# Patient Record
Sex: Male | Born: 1955 | Race: White | Hispanic: No | Marital: Married
Health system: Midwestern US, Academic
[De-identification: ages and names within clinical notes are randomized; demographics above are authoritative.]

---

## 2020-07-30 ENCOUNTER — Encounter: Admit: 2020-07-30 | Discharge: 2020-07-30 | Payer: MEDICARE

## 2020-07-30 NOTE — Telephone Encounter
07/30/20- Records Received from [facility] and [facility] have been scanned to chart and available in the [facility] button.  Thank you,  Ascencion Dike  HIM Specialist - Cardiovascular Medicine  [facility]  [address]  [place], [place] [address]  [phone]

## 2020-07-30 NOTE — Telephone Encounter
07/30/20 Records requested per staff message below edh  ____________________________________    Patient is now scheduled on 01/25 with Dr. Sandria Manly   Please request records from [facility] Phone: [phone]  And [facility]  Phone: [phone]. Per  patient,  had cardiac care (Consult and stress test)  there a year ago     Thank you

## 2020-07-31 ENCOUNTER — Encounter: Admit: 2020-07-31 | Discharge: 2020-07-31 | Payer: MEDICARE

## 2020-08-07 ENCOUNTER — Encounter: Admit: 2020-08-07 | Discharge: 2020-08-07 | Payer: MEDICARE

## 2020-08-12 ENCOUNTER — Encounter: Admit: 2020-08-12 | Discharge: 2020-08-12 | Payer: MEDICARE

## 2020-08-12 DIAGNOSIS — E1169 Type 2 diabetes mellitus with other specified complication: Secondary | ICD-10-CM

## 2020-08-12 DIAGNOSIS — E785 Hyperlipidemia, unspecified: Secondary | ICD-10-CM

## 2020-08-12 DIAGNOSIS — I10 Essential (primary) hypertension: Secondary | ICD-10-CM

## 2020-08-12 DIAGNOSIS — I739 Peripheral vascular disease, unspecified: Secondary | ICD-10-CM

## 2020-08-12 DIAGNOSIS — I447 Left bundle-branch block, unspecified: Secondary | ICD-10-CM

## 2020-08-12 MED ORDER — CHLORTHALIDONE 25 MG PO TAB
12.5 mg | ORAL_TABLET | Freq: Every day | ORAL | 1 refills | Status: AC
Start: 2020-08-12 — End: ?

## 2020-08-19 ENCOUNTER — Encounter: Admit: 2020-08-19 | Discharge: 2020-08-19 | Payer: MEDICARE

## 2020-08-19 DIAGNOSIS — I739 Peripheral vascular disease, unspecified: Secondary | ICD-10-CM

## 2020-08-19 DIAGNOSIS — I10 Essential (primary) hypertension: Secondary | ICD-10-CM

## 2020-08-19 DIAGNOSIS — E785 Hyperlipidemia, unspecified: Secondary | ICD-10-CM

## 2020-08-19 DIAGNOSIS — I447 Left bundle-branch block, unspecified: Secondary | ICD-10-CM

## 2020-08-19 DIAGNOSIS — E1169 Type 2 diabetes mellitus with other specified complication: Secondary | ICD-10-CM

## 2020-08-19 LAB — LIPID PROFILE
Lab: 137
Lab: 247 — ABNORMAL HIGH (ref <150)
Lab: 29 — ABNORMAL LOW (ref >40)
Lab: 49 — ABNORMAL HIGH (ref 5–40)
Lab: 5
Lab: 59

## 2020-08-19 LAB — BASIC METABOLIC PANEL
Lab: 0.9
Lab: 101
Lab: 137
Lab: 19
Lab: 25
Lab: 4.1
Lab: 80

## 2020-08-19 LAB — BNP (B-TYPE NATRIURETIC PEPTI): Lab: 10 — ABNORMAL HIGH (ref 70–105)

## 2020-09-23 ENCOUNTER — Encounter: Admit: 2020-09-23 | Discharge: 2020-09-23 | Payer: MEDICARE

## 2020-09-23 DIAGNOSIS — I10 Essential (primary) hypertension: Secondary | ICD-10-CM

## 2020-09-23 DIAGNOSIS — E785 Hyperlipidemia, unspecified: Secondary | ICD-10-CM

## 2020-09-23 MED ORDER — CHLORTHALIDONE 25 MG PO TAB
25 mg | ORAL_TABLET | Freq: Every day | ORAL | 3 refills | Status: AC
Start: 2020-09-23 — End: ?

## 2020-09-23 NOTE — Patient Instructions
Increase chlorthalidone to 25mg  daily  Recheck Lab in 1 wk  Follow up in 3 months  Follow up as directed.  Call sooner if issues.  Call the [place] nursing line at [phone].  Leave a detailed message for the nurse in [place] with how we can assist you and we will call you back.

## 2020-10-17 ENCOUNTER — Encounter: Admit: 2020-10-17 | Discharge: 2020-10-17 | Payer: MEDICARE

## 2020-10-17 DIAGNOSIS — I10 Essential (primary) hypertension: Secondary | ICD-10-CM

## 2020-10-17 DIAGNOSIS — E785 Hyperlipidemia, unspecified: Secondary | ICD-10-CM

## 2020-10-17 LAB — BASIC METABOLIC PANEL
Lab: 1.2 — ABNORMAL HIGH (ref 0.72–1.25)
Lab: 100
Lab: 109 — ABNORMAL HIGH (ref 70–105)
Lab: 131 — ABNORMAL LOW (ref 136–145)
Lab: 18 — ABNORMAL LOW (ref 23–31)
Lab: 22
Lab: 9.9

## 2020-10-27 ENCOUNTER — Encounter: Admit: 2020-10-27 | Discharge: 2020-10-27 | Payer: MEDICARE

## 2020-10-27 DIAGNOSIS — E782 Mixed hyperlipidemia: Secondary | ICD-10-CM

## 2020-10-27 DIAGNOSIS — I10 Essential (primary) hypertension: Secondary | ICD-10-CM

## 2020-10-27 NOTE — Telephone Encounter
Left message for patient with lab results and recommendations. Phone number provided for any additional questions or concerns. Lab order faxed to [facility] to repeat in 1 week.

## 2020-10-27 NOTE — Telephone Encounter
-----   Message from Altamease Oiler, MD sent at 10/22/2020  4:17 PM CDT -----  Lets have him increase his water intake.  Please ask him to double what ever he is drinking on a daily basis.  Repeat basic metabolic panel in 1 week.  Thanks Star Resler!  ----- Message -----  From: Floy Sabina, RN  Sent: 10/17/2020  10:33 AM CDT  To: Altamease Oiler, MD    Labs for your review and recommendations. Last OV 3/8. Increased chlorthalidone to 25mg  daily at that time. Bump in BUN/Creat. Thanks!

## 2020-11-12 ENCOUNTER — Encounter: Admit: 2020-11-12 | Discharge: 2020-11-12 | Payer: MEDICARE

## 2020-11-12 DIAGNOSIS — E782 Mixed hyperlipidemia: Secondary | ICD-10-CM

## 2020-11-12 DIAGNOSIS — I10 Essential (primary) hypertension: Secondary | ICD-10-CM

## 2020-11-12 LAB — BASIC METABOLIC PANEL
BLD UREA NITROGEN: 17
CALCIUM: 9.5
CHLORIDE: 105
CO2: 25
CREATININE: 1
GFR ESTIMATED: 71
GLUCOSE,PANEL: 140 — ABNORMAL HIGH (ref 70–105)
POTASSIUM: 3.9
SODIUM: 138

## 2021-01-06 ENCOUNTER — Encounter: Admit: 2021-01-06 | Discharge: 2021-01-06 | Payer: MEDICARE

## 2021-01-06 DIAGNOSIS — R600 Localized edema: Secondary | ICD-10-CM

## 2021-01-06 DIAGNOSIS — I10 Essential (primary) hypertension: Secondary | ICD-10-CM

## 2021-01-06 DIAGNOSIS — E785 Hyperlipidemia, unspecified: Secondary | ICD-10-CM

## 2021-01-06 LAB — BASIC METABOLIC PANEL
ANION GAP: 15 — ABNORMAL HIGH (ref 0–14)
BLD UREA NITROGEN: 25
CALCIUM: 9.8
CHLORIDE: 105
CO2: 23
CREATININE: 1.3 — ABNORMAL HIGH (ref 0.72–1.25)
GFR ESTIMATED: 55 — ABNORMAL LOW (ref >59)
GLUCOSE,PANEL: 99
POTASSIUM: 4.8
SODIUM: 138

## 2021-01-06 MED ORDER — CHLORTHALIDONE 25 MG PO TAB
12.5 mg | ORAL_TABLET | Freq: Every day | ORAL | 3 refills | Status: AC
Start: 2021-01-06 — End: ?

## 2021-01-09 ENCOUNTER — Encounter: Admit: 2021-01-09 | Discharge: 2021-01-09 | Payer: MEDICARE

## 2021-01-09 DIAGNOSIS — R7989 Other specified abnormal findings of blood chemistry: Secondary | ICD-10-CM

## 2021-01-09 NOTE — Telephone Encounter
Left a message with lab results and recommendations for repeat labs to be drawn in 2 weeks. Patient uses [facility] lab, order placed and sent to lab.

## 2021-01-09 NOTE — Telephone Encounter
-----   Message from Altamease Oiler, MD sent at 01/09/2021  8:56 AM CDT -----  We cut his chlorthalidone down at the last visit.  Lets repeat a BMP in 2 weeks.  Thanks!  ----- Message -----  From: Lauralee Evener, RN  Sent: 01/06/2021   1:08 PM CDT  To: Altamease Oiler, MD    Lab results following today's appt. Patient's creat is up from 1.09 to 1.37.

## 2021-01-23 ENCOUNTER — Encounter: Admit: 2021-01-23 | Discharge: 2021-01-23 | Payer: MEDICARE

## 2021-01-23 DIAGNOSIS — R7989 Other specified abnormal findings of blood chemistry: Secondary | ICD-10-CM

## 2021-01-23 LAB — BASIC METABOLIC PANEL
BLD UREA NITROGEN: 30 — ABNORMAL HIGH (ref 8.4–25.7)
CALCIUM: 9.7
CHLORIDE: 106
CO2: 24
CREATININE: 1.5 — ABNORMAL HIGH (ref 0.72–1.25)
GLUCOSE,PANEL: 80
POTASSIUM: 4.3
SODIUM: 138

## 2021-02-06 ENCOUNTER — Encounter: Admit: 2021-02-06 | Discharge: 2021-02-06 | Payer: MEDICARE

## 2021-02-06 DIAGNOSIS — R7989 Other specified abnormal findings of blood chemistry: Secondary | ICD-10-CM

## 2021-02-06 LAB — BASIC METABOLIC PANEL
ANION GAP: 13
BLD UREA NITROGEN: 19
CALCIUM: 9.5
CHLORIDE: 106 — ABNORMAL HIGH
CO2: 23
CREATININE: 1.1
GFR ESTIMATED: 67
GLUCOSE,PANEL: 158 — ABNORMAL HIGH
POTASSIUM: 4
SODIUM: 140

## 2021-02-06 NOTE — Telephone Encounter
Left voicemail with recommendations. Left call back number for any questions, comments, or concerns. Instructed patient to call should he have elevated blood pressures.

## 2021-02-06 NOTE — Telephone Encounter
-----   Message from Altamease Oiler, MD sent at 02/06/2021  3:31 PM CDT -----  Labs are good.  Let him know no changes.

## 2021-02-11 ENCOUNTER — Encounter: Admit: 2021-02-11 | Discharge: 2021-02-11 | Payer: MEDICARE

## 2021-02-11 NOTE — Telephone Encounter
Patient reports bp's following chlorthalidone discontinued.    7/23 124/89, 7/24 130/77, 7/25 124/80, 7/26 124/79, 7/27 124/80.      Routed to [facility].

## 2021-02-19 ENCOUNTER — Encounter: Admit: 2021-02-19 | Discharge: 2021-02-19 | Payer: MEDICARE

## 2021-02-19 DIAGNOSIS — I10 Essential (primary) hypertension: Secondary | ICD-10-CM

## 2021-02-19 NOTE — Telephone Encounter
Received a phone call from patient reporting elevated BP's.     8/4: 191/96   8/3: 196/94    He reports that he has been taking all of his prescribed medications. These blood pressures are 1-2 hours after taking morning medications. Pt denies headache, chest pain, vision changes, and is asymptomatic. Discussed if he starts to develop any of this symptoms he will go to the ED. Will discuss with Dr. Sandria Manly.

## 2021-02-19 NOTE — Telephone Encounter
Altamease Oiler, MD  Cvm Nurse [place] 5 minutes ago (4:34 PM)       Restart the chlorthalidone. Repeat a basic metabolic panel in 1 week. If he continues to have systolic blood pressures greater than 190 mmHg please ask him to go to the [facility] emergency department. Thanks!    Message text

## 2021-02-19 NOTE — Telephone Encounter
Discussed recommendations with patient. He verbalized understanding. Lab recs faxed to [facility] hospital.

## 2021-02-27 ENCOUNTER — Encounter: Admit: 2021-02-27 | Discharge: 2021-02-27 | Payer: MEDICARE

## 2021-02-27 DIAGNOSIS — I10 Essential (primary) hypertension: Secondary | ICD-10-CM

## 2021-02-27 LAB — BASIC METABOLIC PANEL
ANION GAP: 11
BLD UREA NITROGEN: 19
CALCIUM: 9.8
CHLORIDE: 104
CO2: 26
CREATININE: 1.3 — ABNORMAL HIGH (ref 0.72–1.25)
GFR ESTIMATED: 56 — ABNORMAL LOW (ref >59)
GLUCOSE,PANEL: 94
POTASSIUM: 4.2
SODIUM: 137

## 2021-03-11 ENCOUNTER — Encounter: Admit: 2021-03-11 | Discharge: 2021-03-11 | Payer: MEDICARE

## 2021-03-11 NOTE — Telephone Encounter
Called pt to discuss lab results.  He states that he is drinking plenty of fluid.  His BPs are much improved, around 120/80s. He states that he is doing okay.  He states that when he makes a u-turn in his car, he gets a little lightheaded.  He states that this is the only time it happens.  Mayo is also battling some allergies at this time.  He is taking an OTC antihistamine.  He has not yet been tested for COVID, but states that if he will go get tested if he continues to have symptoms.  Will callback with any questions, concerns or problems.

## 2021-03-11 NOTE — Telephone Encounter
-----   Message from Altamease Oiler, MD sent at 03/09/2021  4:32 PM CDT -----  Make sure he keeps up on fluid intake.  How is his blood pressure been back on chlorthalidone?  ----- Message -----  From: Rogelia Boga, RN  Sent: 02/27/2021   9:16 AM CDT  To: Altamease Oiler, MD    Labs for your review after restarting chlorthalidone.  Creatinine bumped a little.  Please let me know if you have any additional recommendations.  Thanks!

## 2021-06-23 ENCOUNTER — Encounter: Admit: 2021-06-23 | Discharge: 2021-06-23 | Payer: MEDICARE

## 2021-06-23 DIAGNOSIS — R600 Localized edema: Secondary | ICD-10-CM

## 2021-06-23 DIAGNOSIS — E785 Hyperlipidemia, unspecified: Secondary | ICD-10-CM

## 2021-06-23 DIAGNOSIS — I10 Essential (primary) hypertension: Secondary | ICD-10-CM

## 2021-07-16 ENCOUNTER — Encounter: Admit: 2021-07-16 | Discharge: 2021-07-16 | Payer: MEDICARE

## 2021-07-16 NOTE — Telephone Encounter
Hypertension    Current blood pressure: 166/99  Current heart rate:   Baseline BP/HR: 160s-180s/90s  Associated symptoms: None  Medication Information: Chlorthalidone 12.5mg  daily was recently discontinued at 06/23/21 OV due to hypotension and dizzy spells. Patient states that he recently restarted his medication on his own due to higher pressures.   Additional relevant information:  Patient states that he's doing fine no complaints at the present time. He is just concerned about his pressures being so high. He states that he restarted chlorthalidone 12.5mg  a few days ago and his pressures are still pretty elevated, in the 160s/90s range. Patient states that his neuropathy has been getting worse and might contribute to some of the elevated pressures to his pain. Confirmed with patient that he is taking all of his medications as prescribed.       Care Advice: Take all medication as prescribed by your physician. Continue to monitor blood pressures. Will discuss with WTL and call patient with recommendations. Patient verbalized understanding.     Disposition: RN to Call Patient Back with Provider Recommendations

## 2021-12-02 ENCOUNTER — Encounter: Admit: 2021-12-02 | Discharge: 2021-12-02 | Payer: MEDICARE

## 2021-12-24 ENCOUNTER — Encounter: Admit: 2021-12-24 | Discharge: 2021-12-24 | Payer: MEDICARE

## 2021-12-24 DIAGNOSIS — E785 Hyperlipidemia, unspecified: Secondary | ICD-10-CM

## 2021-12-24 NOTE — Telephone Encounter
Patient is scheduled for an office visit with WTL on 01/05/22. It was noticed he needed to have fasting lipid panel prior to appointment. Order placed and faxed to [facility] hospital where the patient has had labs drawn before. Left a message for the patient with this information and requested to have drawn prior to appointment. Callback number provided for further questions or concerns.

## 2021-12-30 ENCOUNTER — Encounter: Admit: 2021-12-30 | Discharge: 2021-12-30 | Payer: MEDICARE

## 2021-12-30 DIAGNOSIS — E785 Hyperlipidemia, unspecified: Secondary | ICD-10-CM

## 2021-12-30 LAB — LIPID PROFILE
CHOLESTEROL: 126
HDL: 29 — ABNORMAL LOW (ref >=40)
LDL: 56
TRIGLYCERIDES: 207 — ABNORMAL HIGH (ref <150)
VLDL: 41 — ABNORMAL HIGH (ref 5–40)

## 2022-01-05 ENCOUNTER — Encounter: Admit: 2022-01-05 | Discharge: 2022-01-05 | Payer: MEDICARE

## 2022-01-05 DIAGNOSIS — I4891 Unspecified atrial fibrillation: Secondary | ICD-10-CM

## 2022-01-05 DIAGNOSIS — Z136 Encounter for screening for cardiovascular disorders: Secondary | ICD-10-CM

## 2022-01-05 DIAGNOSIS — I10 Essential (primary) hypertension: Secondary | ICD-10-CM

## 2022-01-05 DIAGNOSIS — I739 Peripheral vascular disease, unspecified: Secondary | ICD-10-CM

## 2022-01-05 DIAGNOSIS — E785 Hyperlipidemia, unspecified: Secondary | ICD-10-CM

## 2022-01-05 LAB — THYROID STIMULATING HORMONE-TSH: TSH: 1

## 2022-01-05 LAB — CBC
HEMATOCRIT: 43 K/UL — ABNORMAL LOW (ref 150–400)
HEMOGLOBIN: 14 % (ref 11–15)
MCH: 31 % (ref 41–77)
MCHC: 34 % (ref 24–44)
MCV: 91 FL (ref 7–11)
MPV: 10 % (ref >60)
PLATELET COUNT: 238 % (ref 4–12)
RBC COUNT: 4.7 g/dL (ref 32.0–36.0)
RDW: 11 % (ref 0–2)
WBC COUNT: 9.7 pg — ABNORMAL HIGH (ref 4.23–9.07)

## 2022-01-05 LAB — COMPREHENSIVE METABOLIC PANEL
ALBUMIN: 4.2 — ABNORMAL HIGH (ref 34–104)
ALK PHOSPHATASE: 68
ALT: 14 — ABNORMAL HIGH (ref 0.30–1.00)
ANION GAP: 10
AST: 16
BLD UREA NITROGEN: 28 — ABNORMAL HIGH (ref 8.4–25.7)
CALCIUM: 9.8 — ABNORMAL HIGH (ref 0.7–1.3)
CHLORIDE: 106
CO2: 22 — ABNORMAL LOW (ref 23–31)
CREATININE: 1.2 — ABNORMAL HIGH (ref 0.72–1.25)
GFR ESTIMATED: 59
GLUCOSE,PANEL: 107 — ABNORMAL HIGH (ref 70–105)
POTASSIUM: 4.3 — ABNORMAL LOW (ref 136–145)
SODIUM: 138
TOTAL BILIRUBIN: 0.7 — ABNORMAL LOW (ref >59.00)
TOTAL PROTEIN: 7.7 (ref >59.00)

## 2022-01-05 LAB — MAGNESIUM: MAGNESIUM: 1.4 — ABNORMAL LOW (ref 1.6–2.6)

## 2022-01-05 MED ORDER — APIXABAN 5 MG PO TAB
5 mg | ORAL_TABLET | Freq: Two times a day (BID) | ORAL | 3 refills | Status: CN
Start: 2022-01-05 — End: ?

## 2022-01-05 MED ORDER — SPIRONOLACTONE 25 MG PO TAB
12.5 mg | ORAL_TABLET | Freq: Every day | ORAL | 3 refills | 90.00000 days | Status: AC
Start: 2022-01-05 — End: ?

## 2022-01-05 MED ORDER — MAGNESIUM OXIDE 400 MG MAGNESIUM PO CAP
400 mg | ORAL_CAPSULE | Freq: Every day | ORAL | 0 refills | Status: CN
Start: 2022-01-05 — End: ?

## 2022-01-05 MED ORDER — MAGNESIUM OXIDE 400 MG MAGNESIUM PO CAP
400 mg | ORAL_CAPSULE | Freq: Every day | ORAL | 0 refills | Status: AC
Start: 2022-01-05 — End: ?

## 2022-01-05 NOTE — Telephone Encounter
Dr. Sandria Manly reviewed patient's most recent lab results. He would like for patient to start Magnesium Oxide 400mg  daily for 14 days. Called and discussed with patient. Patient is agreeable to care plan and has no further questions at this time.       Is is to go home and check his medications. If he is not already on another blood thinner Dr. would like for patient to start Eliquis 5mg  BID.

## 2022-01-05 NOTE — Patient Instructions
Thank you for visiting our office today.    We would like to make the following medication adjustments:      Decrease Spironolactone to 12.5mg  (half tablet)      Otherwise continue the same medications as you have been doing.          We will be pursuing the following tests after your appointment today:       Orders Placed This Encounter    CBC    COMPREHENSIVE METABOLIC PANEL    THYROID STIMULATING HORMONE-TSH    MAGNESIUM    ECG 12-LEAD    ECG Today (all locations)    spironolactone (ALDACTONE) 25 mg tablet     **Have labs drawn today**    Check your medications at home and all [place] back with updated medication list.     We will plan to see you back in 3 months.  Please call us in the meantime with any questions or concerns.        Please allow 5-7 business days for our providers to review your results. All normal results will go to MyChart. If you do not have Mychart, it is strongly recommended to get this so you can easily view all your results. If you do not have mychart, we will attempt to call you once with normal lab and testing results. If we cannot reach you by phone with normal results, we will send you a letter.  If you have not heard the results of your testing after one week please give [place] a call.       Your Cardiovascular Medicine [place] [facility] Team Brett Canales, Pilar Jarvis, Shawna Orleans, and [place])  phone number is [phone].

## 2022-01-05 NOTE — Progress Notes
Date of Service: 01/05/2022    Cole Fischer is a 66 y.o. male.       Chief Complaint: Follow-up    History of Present Illness:     I had the pleasure of seeing Cole Fischer back in our [place] office this morning for cardiovascular followup.   ?  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    Since our last visit Cole Fischer tells me he has gotten along pretty well.  He is still having some lightheadedness and low blood pressure readings from time to time.  He did come in today with note from Dr. Andreas Newport about potentially reducing his spironolactone given his hypotension.      Recently he developed an infection in the stump of his right lower extremity.  He was just started on cephalexin by Dr. Andreas Newport.  He tells me despite this he is feeling well.  No fevers or chills.  Just the other day gotten 10,000 steps despite using crutches.  He did not have any chest pain or shortness of breath with this.  No orthopnea, paroxysmal nocturnal dyspnea, or lower extremity swelling.  ?  We did obtain an ECG in the office today.  My interpretation is atrial fibrillation.  Ventricular rate 86 bpm.  Left bundle branch block.  QTc 490 ms.         Past Medical History:  Patient Active Problem List    Diagnosis Date Noted   ? Essential hypertension 08/07/2020   ? Hyperlipidemia 08/07/2020   ? Type 2 diabetes mellitus (HCC) 08/07/2020   ? Lower extremity edema 08/07/2020   ? Left bundle branch block 08/07/2020     -08/23/19- MPI- Normal global LV function. EF 61%. Medium defect of mild to moderate intensity during stress. Fixed perfusion abnormality in the mid anterior, apical anterior, and septal and apex segments consistent with attenuation and artifact. Negative stress test for evidence of pharmacologically induced ischemia.   -08/23/19 Echo- EF 55%     ? PVD (peripheral vascular disease) (HCC) 08/07/2020         Review of Systems   Constitutional: Negative.   HENT: Negative.    Eyes: Negative.    Cardiovascular: Negative.    Respiratory: Negative.    Endocrine: Negative.    Hematologic/Lymphatic: Negative.    Skin: Negative.    Musculoskeletal: Negative.    Gastrointestinal: Negative.    Genitourinary: Negative.    Neurological: Negative.    Psychiatric/Behavioral: Negative.    Allergic/Immunologic: Negative.        Vitals:    01/05/22 0807   BP: 124/72   BP Source: Arm, Left Upper   Pulse: 82   SpO2: 99%   O2 Device: None (Room air)   PainSc: Zero   Weight: 103.9 kg (229 lb)   Height: 190.5 cm (6' 3)     Body mass index is 28.62 kg/m?Marland Kitchen    Physical Examination:  General Appearance: No acute distress. Fully alert and oriented.  Skin: Warm. No ulcers or xanthomas.   HEENT: Grossly unremarkable. Lips and oral mucosa without pallor or cyanosis. Moist mucous membranes.   Neck Veins: Normal jugular venous pressure. Neck veins are not distended.  Carotid Arteries: Normal carotid upstroke bilaterally. No bruits.  Chest Inspection: Chest is normal in appearance.  Auscultation/Percussion: Normal  respiratory effort. Lungs clear to auscultation bilaterally. No wheezes, rales, or rhonchi.    Cardiac Rhythm: Regular rhythm. Normal rate.  Cardiac Auscultation: Normal S1 & S2. No S3 or S4. No rub.  Murmurs: No cardiac murmurs.  Peripheral Circulation: Normal peripheral circulation.   Abdominal Aorta: No abdominal aortic bruit.  Extremities: Right lower extremity with above-the-knee amputation.  No swelling in left lower extremity.  Abdominal Exam: Soft, non-tender. No masses, no organomegaly. Normal bowel sounds.  Neurologic Exam: Neurological assessment grossly intact.       Assessment and Plan:  1. New diagnosis atrial fibrillation: ECG today demonstrated atrial fibrillation.  From my understanding this is a new diagnosis.  (Never heard of having an abnormal heart rhythm in the past either.  He is not feeling any palpitations or heart racing.  Recently he has been doing his normal activity without shortness of breath or significant fatigue.  Is unclear how long he has been in the atrial fibrillation.  His CHADS2VASC score is 3 (hypertension, diabetes, age).  I am going to start him on apixaban 5 mg twice daily today once we confirm that he is not actually on a blood thinner at home (I believe he is confusing his cilostazol as being a blood thinner).  He is currently being treated for infection on his right lower extremity.  This may be the reason for the atrial fibrillation.  As he is currently rate controlled I would like to repeat an ECG in 2 weeks to see if he is still in atrial fibrillation.  If he is back in sinus rhythm after treatment with antibiotics then I would recommend simply continuing anticoagulation indefinitely.  If he is still in atrial fibrillation then I think further cardiovascular work-up is indicated.  I would recommend an echocardiogram, stress myocardial perfusion study, 5-day Holter monitor at that time.  He may ultimately need to undergo DCCV with TEE guidance.  We will simply continue his metoprolol for rate control strategy at this time.  In the past has had issues with sinus bradycardia on minimal metoprolol so not sure how well he will handle a rate control strategy.  Ultimately he may need rhythm control strategy or perhaps even permanent pacemaker.  Today we will order basic lab work to include CBC, CMP, TSH.  I will plan to see him back in the clinic as soon as we can arrange a time.  2. Hypertension:   Sounds like he is running low blood pressure at home again.  He is also having some symptoms of lightheadedness.  I agree with Dr. Gilles Chiquito recommendation of reducing spironolactone.  We will cut the dose in half from 25 mg daily to 12.5 mg daily.  Continue all other antihypertensive medications.  3. Conduction disease:  Dietrick has left bundle branch block on ECG.  Etiology is unclear.  He had a stress myocardial perfusion done through [facility] in 2021.  This demonstrated mid to apical anterior, anteroseptal and anterior apical perfusion abnormality.  This was reportedly suggestive of soft tissue attenuation artifact.  He also had echocardiogram at the same time that showed hypokinesis in the mid to anterior septum.  I do wonder if this stress test with actually demonstrating a prior left anterior descending myocardial infarction.  Either way, there is no evidence of myocardial ischemia on the stress test.    Will probably plan to repeat testing with echo and stress myocardial perfusion study in a couple of weeks after repeat ECG.  4. Dyslipidemia:  His goal LDL is less than 70.  Most recent fasting lipid panel from 12/30/2021 with LDL of 56.  Continue current dose of pravastatin.   5. Type 2 diabetes mellitus:  He seems to be doing well on metformin.  He tells me his hemoglobin A1c is well controlled.          Total time spent on today's office visit was 45 minutes. This includes face-to-face in person visit with patient as well as non face-to-face time including review of the electronic medical record, outside records, labs, radiologic studies, cardiovascular studies, formulation of treatment plan, after visit summary, future disposition, personal discussions, and documentation.    Current Medications (including today's revisions)  ? carvediloL (COREG) 6.25 mg tablet Take one tablet by mouth twice daily with meals. Take with food.   ? cephalexin (KEFLEX) 500 mg capsule    ? chlorthalidone (HYGROTON) 25 mg tablet Take one-half tablet by mouth daily.   ? ciclopirox (LOPROX) 0.77 % topical cream Apply  topically to affected area as Needed.   ? cilostazoL (PLETAL) 50 mg tablet Take one tablet by mouth twice daily.   ? gabapentin (NEURONTIN) 800 mg tablet Take one tablet by mouth three times daily. Patient takes 800mg  AM, 400mg  at noon, and 800mg  PM   ? metFORMIN (GLUCOPHAGE) 500 mg tablet Take one tablet by mouth twice daily.   ? pantoprazole DR (PROTONIX) 40 mg tablet Take one tablet by mouth twice daily.   ? pravastatin (PRAVACHOL) 80 mg tablet Take one tablet by mouth daily.   ? spironolactone (ALDACTONE) 25 mg tablet Take one tablet by mouth daily.   ? valsartan (DIOVAN) 320 mg tablet Take one tablet by mouth daily.

## 2022-01-11 ENCOUNTER — Encounter: Admit: 2022-01-11 | Discharge: 2022-01-11 | Payer: MEDICARE

## 2022-01-11 NOTE — Progress Notes
Medication Assistance Program packet has been sent to patient for signatures and other required documents (if applicable) . Once received back, medication assistance coordinator will begin processing     Cole Fischer  Medication Assitance Coordinator  [phone]

## 2022-01-21 ENCOUNTER — Encounter: Admit: 2022-01-21 | Discharge: 2022-01-21 | Payer: MEDICARE

## 2022-01-21 ENCOUNTER — Ambulatory Visit: Admit: 2022-01-21 | Discharge: 2022-01-21 | Payer: MEDICARE

## 2022-01-21 DIAGNOSIS — I4891 Unspecified atrial fibrillation: Secondary | ICD-10-CM

## 2022-01-21 DIAGNOSIS — I447 Left bundle-branch block, unspecified: Secondary | ICD-10-CM

## 2022-01-21 DIAGNOSIS — I10 Essential (primary) hypertension: Secondary | ICD-10-CM

## 2022-01-21 DIAGNOSIS — R600 Localized edema: Secondary | ICD-10-CM

## 2022-01-21 MED ORDER — APIXABAN 5 MG PO TAB
5 mg | ORAL_TABLET | Freq: Two times a day (BID) | ORAL | 0 refills | Status: CN
Start: 2022-01-21 — End: ?

## 2022-01-21 NOTE — Progress Notes
To our valued patient,     We have enrolled your heart monitor and requested it to be mailed to your home.  You should receive this within 2-3 business days. Please wear the monitor for 5 days. When you have completed the study, please remove the device, and mail it back to the company. Please call [employer] Customer Service at [phone] with questions about placement, troubleshooting, and insurance coverage. You can reach the ambulatory heart monitor team at [phone].        ? Please write your NAME, PHYSICIAN, START DATE/TIME on the diary.    ? Prep skin by shaving and ensuring there is no lotion on the chest.  Gently abrade for clear ecgs.  ? Showering is okay, but best to shower with back to the water.    ? Please use the diary for symptoms - specific date and times and what you are feeling.  ? Please return the device in the mailer with diary promptly after completing the study.        Your Heart Rhythm Management Team  Cardiovascular Medicine Department at [facility]          Ambulatory (External) Cardiac Monitor Enrollment Record     Placement Location: Home Enrollment  Vendor: [employer] ([employer])  Mobile Cardiac Telemetry (MCOT/MCT)?: No  Duration of Monitor (in days): 5  Monitor Diagnosis: Other (I44.7 Left bundle-branch block, unspecified)  Secondary Monitor Diagnosis: Atrial Fibrillation (I48.91)  Ordering Provider: Allen Kell  AMB Monitor Serial Number: Home  No data recorded    Start Time and Date: 01/21/22 11:57 AM   Patient Name: Cole Fischer  DOB: [birth year]  MRN: [id number]  Sex: male  Mobile Phone Number: [phone] (mobile)  Home Phone Number: [phone]  Patient Address: [address] [place] [address]  Insurance Coverage: MEDICARE PART A AND B  Insurance ID: [id number]  Insurance Group #:   Insurance Subscriber: Caamano,Diana R  Implanted Cardiac Device Information: No results found for: [id number]      Patient instructed to contact company phone number on the monitor box with questions regarding billing, placement, troubleshooting.     Rosalin Hawking    ____________________________________________________________    Clinic Staff:    ? Complete additional steps for documentation double check/Co-Sign.  ? In Follow-up, send chart upon closing encounter to P CVM HRM AMBULATORY MONITORS    HRM Ambulatory Monitoring Team:  1. Schedule on appropriate template and check-in.   Clinic Placement Schedule on clinic location [facility] schedule   Home Enrollment Schedule on Home Enrollment schedule ([facility]M)   Given to patient in clinic for self-placement Schedule on Home Enrollment schedule ([facility]M)   Inpatient Schedule on [place] [facility] AMBULATORY MONITORING template   2. Please enroll with appropriate vendor.

## 2022-01-21 NOTE — Patient Instructions
Eliquis 30 days coupon  Echo   Stress test  Follow up with Dr. Sandria Manly    Follow up as directed.  Call sooner if issues.  Call the [place] nursing line at [phone].  Leave a detailed message for the nurse in [place] with how we can assist you and we will call you back.

## 2022-01-25 ENCOUNTER — Encounter: Admit: 2022-01-25 | Discharge: 2022-01-25 | Payer: MEDICARE

## 2022-01-25 MED ORDER — APIXABAN 5 MG PO TAB
5 mg | ORAL_TABLET | Freq: Two times a day (BID) | ORAL | 0 refills | Status: DC
Start: 2022-01-25 — End: 2022-01-25

## 2022-01-25 MED ORDER — APIXABAN 5 MG PO TAB
5 mg | ORAL_TABLET | Freq: Two times a day (BID) | ORAL | 0 refills | Status: AC
Start: 2022-01-25 — End: ?

## 2022-01-28 ENCOUNTER — Encounter: Admit: 2022-01-28 | Discharge: 2022-01-28 | Payer: MEDICARE

## 2022-01-29 ENCOUNTER — Encounter: Admit: 2022-01-29 | Discharge: 2022-01-29 | Payer: MEDICARE

## 2022-01-29 NOTE — Telephone Encounter
Discussed with Dr. Sandria Manly. Patient is currently in atrial fibrillation and will need some form of anticoagulation. Dr.Love said if patient not able to afford Eliquis or Xarelto then patient should be started on warfarin. Dr. Sandria Manly states that October follow up should be sufficient for patient's current status.      Dicussed recommendations with patient. Patient states that he received paperwork for assistance for Eliquis and he will work on getting that submitted. He states that he really does not want to start Warfarin. Patient states that testing is scheduled scheduled for 7/25 at [facility]. Patient has no further questions or concerns at this time.

## 2022-01-29 NOTE — Telephone Encounter
-----   Message from Rogelia Boga, RN sent at 01/28/2022  4:04 PM CDT -----  Regarding: Please ask WTL  [place] called and states that the eliquis is too expensive.  He has some currently, but states he can't afford to continue with it.  I did tell him we could try for patient assistance, but he was hesitant about that.  He also wanted to check on follow up.  He is scheduled in October, but said Brett Canales was going to check and see if he could get in sooner.  He has the holter, but he is not yet scheduled for the echo and stress test.  Can you please ask WTL and follow up with the patient on his recommendations??    Thank you for your help!  Asher Muir

## 2022-02-04 ENCOUNTER — Encounter: Admit: 2022-02-04 | Discharge: 2022-02-04 | Payer: MEDICARE

## 2022-02-04 NOTE — Telephone Encounter
Received call on BAT phone regarding urgent [employer] report.  ECG report showed new onset Afib starting at 8:42 PM 7/11 and continuing with 100% burden for 4 day and 23 hours (the duration of the monitor wear time). Heart rate at onset 174 bpm. Patient saw Dr. Sandria Manly in office on 6/20 and was in AF with rate of 86 bpm at that time. Eliquis was initiated and is current on patients MAR. Patient takes Coreg but may need something for rate control.  Message has been sent to white team to review and follow up

## 2022-02-04 NOTE — Telephone Encounter
Attempted to reach pt to check symptoms and verify that he is taking medications as prescribed.  No answer, no voicemail.

## 2022-02-09 ENCOUNTER — Ambulatory Visit: Admit: 2022-02-09 | Discharge: 2022-02-09 | Payer: MEDICARE

## 2022-02-09 ENCOUNTER — Encounter: Admit: 2022-02-09 | Discharge: 2022-02-09 | Payer: MEDICARE

## 2022-02-09 DIAGNOSIS — I447 Left bundle-branch block, unspecified: Secondary | ICD-10-CM

## 2022-02-12 ENCOUNTER — Encounter: Admit: 2022-02-12 | Discharge: 2022-02-12 | Payer: MEDICARE

## 2022-02-12 DIAGNOSIS — I2510 Atherosclerotic heart disease of native coronary artery without angina pectoris: Secondary | ICD-10-CM

## 2022-02-12 DIAGNOSIS — R9439 Abnormal result of other cardiovascular function study: Secondary | ICD-10-CM

## 2022-02-12 DIAGNOSIS — I10 Essential (primary) hypertension: Secondary | ICD-10-CM

## 2022-02-12 DIAGNOSIS — I739 Peripheral vascular disease, unspecified: Secondary | ICD-10-CM

## 2022-02-12 MED ORDER — ASPIRIN 325 MG PO TAB
325 mg | Freq: Once | ORAL | 0 refills
Start: 2022-02-12 — End: ?

## 2022-02-12 NOTE — Patient Instructions
CARDIAC CATHETERIZATION   PRE-ADMISSION INSTRUCTIONS    Patient Name: Cole Fischer  MRN#: [id number]  Date of Birth: 19-Sep-1955 (66 y.o.)  Today's Date: 02/12/2022    PROCEDURE:  You are scheduled for a Coronary Angiogram with possible Angioplasty/Stenting with Dr. Salley Scarlet Hajj.    PROCEDURE DATE AND ARRIVAL TIME:  Your procedure date is 02/22/22.  You will receive a call from the Cath lab staff between 8:00 a.m. and noon on the business day prior to your procedure to let you know at what time to arrive on the day of your procedure.    Please check in at the Admitting Desk in the [facility] for your procedure. [facility] Entrance and and take a right. Continue down the hallway past the Cardiovascular Medicine office. That hall will take you into the [facility]. Check in at the desk on the left side.)     (If you have further questions regarding your arrival time for the CV lab, please call [phone] by 3:00pm the day before your procedure. Please leave a message with your name and number, your call will be returned in a timely manner.)    PRE-PROCEDURE APPOINTMENTS:    8/1 at 8:40   Office visit to update history and physical (requirement within 30 days of procedure)  with  Dr. Harvel Ricks  at Cardiovascular Medicine  [place] [facility]       02/16/22   Pre-Admission lab work required within 14 days of procedure: BMP and CBC  [facility].         FOOD AND DRINK INSTRUCTIONS  Nothing to eat after midnight before your procedure. No caffeine for 24 hours prior to your procedure. You will be under moderate sedation for your procedure.  You may drink clear liquids up to an hour before hospital arrival. This will be confirmed by the Cath lab staff the day before your procedure.     SPECIAL MEDICATION INSTRUCTIONS  Any new prescriptions will be sent to your pharmacy listed on file with [place].        Please either take 4 baby aspirins (4 times 81mg ) or one full strength NON-COATED 325mg  aspirin.  apixaban (Eliquis): hold 2 days and AM of procedure. Last dose on 7/6.   Hypoglycemics: metformin (Glucophage) -- hold the morning of your procedure.        HOLD ALL erectile dysfunction medications for 3 days, unless prescribed for pulmonary hypertension.  HOLD ALL over the counter vitamins or supplements on the morning of your procedure.      Additional Instructions  If you wear CPAP, please bring your mask and machine with you to the hospital.    Take a bath or shower with anti-bacterial soap the evening before, or the morning of the procedure.     Bring photo ID and your health insurance card(s).    Arrange for a driver to take you home from the hospital. Please arrange for a friend or family member to take you home from this test. You cannot take a Taxi, [employer] Goad, or public transportation as there has to be a responsible person to help care for you after sedation    Bring an accurate list of your current medications with you to the hospital (all medications and supplements taken daily).  Please use the medication list below and write in the date and time when you took your last dose before your procedure. Update this list of medications as needed.      Wear comfortable clothes  and don't bring valuables, other than photo identification card, with you to the hospital.    Please pack a bag for an overnight stay.     Please review your pre-procedure instructions and bring them with you on the day of your procedure.  Call the office at  [phone]  with any questions. You may ask to speak with Dr. Zenaida Deed nurse.        ALLERGIES  Allergies   Allergen Reactions    Amlodipine EDEMA     Leg swelling    Amoxicillin VOMITING    Clavulanic Acid VOMITING       CURRENT MEDICATIONS  Outpatient Encounter Medications as of 02/12/2022   Medication Sig Dispense Refill    apixaban (ELIQUIS) 5 mg tablet Take one tablet by mouth twice daily. 60 tablet 0    carvediloL (COREG) 6.25 mg tablet Take one tablet by mouth twice daily with meals. Take with food. 180 tablet 3    cephalexin (KEFLEX) 500 mg capsule       chlorthalidone (HYGROTON) 25 mg tablet Take one-half tablet by mouth daily. 90 tablet 1    ciclopirox (LOPROX) 0.77 % topical cream Apply  topically to affected area as Needed.      cilostazoL (PLETAL) 50 mg tablet Take one tablet by mouth twice daily.      gabapentin (NEURONTIN) 800 mg tablet Take one tablet by mouth three times daily. Patient takes 800mg  AM, 400mg  at noon, and 800mg  PM      magnesium oxide 400 mg magnesium capsule Take one capsule by mouth daily. 14 capsule 0    metFORMIN (GLUCOPHAGE) 500 mg tablet Take one tablet by mouth twice daily.      pantoprazole DR (PROTONIX) 40 mg tablet Take one tablet by mouth twice daily.      pravastatin (PRAVACHOL) 80 mg tablet Take one tablet by mouth daily.      spironolactone (ALDACTONE) 25 mg tablet Take one-half tablet by mouth daily. 45 tablet 3    valsartan (DIOVAN) 320 mg tablet Take one tablet by mouth daily.       No facility-administered encounter medications on file as of 02/12/2022.       _________________________________________  Form completed by: Rogelia Boga, RN  Date completed: 02/12/22  Method: In person and given to the patient.

## 2022-02-12 NOTE — Progress Notes
Application for patient's Eliquis has been submitted to prescriber for signatures.    Alaysha Jefcoat  Medication Assitance Coordinator  [id number]

## 2022-02-12 NOTE — Telephone Encounter
-----   Message from Altamease Oiler, MD sent at 02/11/2022  2:53 PM CDT -----  Please let Cole Fischer know that his stress test looked abnormal.  I suspect there is probably a blockage in one of his heart arteries.  I would recommend we move forward with coronary angiogram if he is willing.  Let me know if he has any questions.    Thank you!

## 2022-02-12 NOTE — Progress Notes
Medicare is listed as patient's primary insurance coverage.  Pre-certification is not required for hospitalizations.

## 2022-02-12 NOTE — Telephone Encounter
Called and discussed results with patient.  No questions at this time.  Pt will callback with any questions, concerns or problems.  Scheduled pt for LHC and updated h&p.

## 2022-02-15 ENCOUNTER — Encounter: Admit: 2022-02-15 | Discharge: 2022-02-15 | Payer: MEDICARE

## 2022-02-16 ENCOUNTER — Encounter: Admit: 2022-02-16 | Discharge: 2022-02-16 | Payer: MEDICARE

## 2022-02-16 ENCOUNTER — Ambulatory Visit: Admit: 2022-02-16 | Discharge: 2022-02-17 | Payer: MEDICARE

## 2022-02-16 DIAGNOSIS — I447 Left bundle-branch block, unspecified: Secondary | ICD-10-CM

## 2022-02-16 DIAGNOSIS — E1169 Type 2 diabetes mellitus with other specified complication: Secondary | ICD-10-CM

## 2022-02-16 DIAGNOSIS — I739 Peripheral vascular disease, unspecified: Secondary | ICD-10-CM

## 2022-02-16 DIAGNOSIS — I10 Essential (primary) hypertension: Secondary | ICD-10-CM

## 2022-02-16 DIAGNOSIS — I4891 Unspecified atrial fibrillation: Secondary | ICD-10-CM

## 2022-02-16 DIAGNOSIS — R9439 Abnormal result of other cardiovascular function study: Secondary | ICD-10-CM

## 2022-02-16 LAB — BASIC METABOLIC PANEL
ANION GAP: 11
BLD UREA NITROGEN: 19
CALCIUM: 9.8
CHLORIDE: 105
CO2: 23
CREATININE: 1.1
GFR ESTIMATED: 70
GLUCOSE,PANEL: 119 — ABNORMAL HIGH (ref 70–105)
POTASSIUM: 3.6
SODIUM: 139

## 2022-02-16 LAB — CBC
HEMATOCRIT: 41
HEMOGLOBIN: 14
MCH: 30
MCHC: 34
MCV: 88
MPV: 10
PLATELET COUNT: 217
RBC COUNT: 4.6
RDW: 12
WBC COUNT: 9.4 — ABNORMAL HIGH (ref 4.23–9.07)

## 2022-02-16 NOTE — Progress Notes
Date of Service: 02/16/2022    Cole Fischer is a 66 y.o. male.       HPI     Patient is a 66 year old male past medical history of hypertension, hyperlipidemia, and type 2 diabetes mellitus.  History of peripheral vascular disease and is status post right lower extremity above-the-knee amputation.  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  Initially on oral antibiotics and now take on a 6-week course of IV antibiotics.  Notes that he is completing about the third week of this IV treatment.  Clinically the lesion at the stump has resolved and symptoms of the pain and redness are essentially resolved.  Was found to have atrial fibrillation when he was seen by Dr. Sandria Manly at the end of June.  Heart rates been well controlled he is relatively asymptomatic.  He has been started on Eliquis which he tolerated well but cost is going to be prohibitive.  Holter monitor showed controlled heart rate without other arrhythmias or pauses.  Echocardiogram showed low normal left ventricular ejection fraction, no findings for significant right ventricular abnormalities, pulmonary hypertension, or significant valvular abnormalities.  The nuclear cardiac stress test estimated diminished left ventricular ejection fraction and showed possible defect in the anterior wall.  Quality of the study was somewhat limiting and affected the accuracy.  At this time patient is scheduled to undergo her coronary angiograms for further assessment with possible intervention.         Vitals:    02/16/22 0846   BP: 122/78   BP Source: Arm, Left Upper   Pulse: 83   SpO2: 96%   O2 Device: None (Room air)   PainSc: Zero   Weight: 108 kg (238 lb 3.2 oz)   Height: 193 cm (6' 4)     Body mass index is 28.99 kg/m?Marland Kitchen     Past Medical History  Patient Active Problem List    Diagnosis Date Noted   ? Essential hypertension 08/07/2020   ? Hyperlipidemia 08/07/2020   ? Type 2 diabetes mellitus (HCC) 08/07/2020   ? Lower extremity edema 08/07/2020   ? Left bundle branch block 08/07/2020     -08/23/19- MPI- Normal global LV function. EF 61%. Medium defect of mild to moderate intensity during stress. Fixed perfusion abnormality in the mid anterior, apical anterior, and septal and apex segments consistent with attenuation and artifact. Negative stress test for evidence of pharmacologically induced ischemia.   -08/23/19 Echo- EF 55%     ? PVD (peripheral vascular disease) (HCC) 08/07/2020         Review of Systems   Constitutional: Negative.   HENT: Negative.    Eyes: Negative.    Cardiovascular: Negative.    Respiratory: Negative.    Endocrine: Negative.    Hematologic/Lymphatic: Negative.    Skin: Negative.    Musculoskeletal: Negative.    Gastrointestinal: Negative.    Genitourinary: Negative.    Neurological: Negative.    Psychiatric/Behavioral: Negative.    Allergic/Immunologic: Negative.        Physical Exam  Awake and alert, no distress with somewhat disheveled appearance.  Appears older than given age  Pupils Rigg react without scleral injection  Neck is supple no carotid upstroke and no bruits, no masses or jugular venous abnormalities  Chest is symmetric and lungs clear to auscultation  Heart S1, S2 that are normal.  No significant murmurs, clicks, or gallops  Abdomen is very protuberant but soft  Pulses are 2+, irregular but  symmetric at the radial locations as well as at the pedal locations in the left foot, the dressing at the right amputation site are intact with no obvious redness or tenderness  There is 2+ edema at the lower right extremity with no skin breakdown or significant pitting    Cardiovascular Studies      Cardiovascular Health Factors  Vitals BP Readings from Last 3 Encounters:   02/16/22 122/78   02/09/22 108/74   01/05/22 124/72     Wt Readings from Last 3 Encounters:   02/16/22 108 kg (238 lb 3.2 oz)   02/09/22 103.9 kg (229 lb)   01/05/22 103.9 kg (229 lb)     BMI Readings from Last 3 Encounters:   02/16/22 28.99 kg/m? 02/09/22 27.87 kg/m?   01/05/22 28.62 kg/m?      Smoking Social History     Tobacco Use   Smoking Status Former   ? Types: Cigarettes   ? Quit date: 2017   ? Years since quitting: 6.5   Smokeless Tobacco Never      Lipid Profile Cholesterol   Date Value Ref Range Status   12/30/2021 126  Final     HDL   Date Value Ref Range Status   12/30/2021 29 (L) >=40 Final     LDL   Date Value Ref Range Status   12/30/2021 56  Final     Triglycerides   Date Value Ref Range Status   12/30/2021 207 (H) <150 Final      Blood Sugar Hemoglobin A1C   Date Value Ref Range Status   05/13/2020 13.0 (H) <5.7 Final     Glucose   Date Value Ref Range Status   02/11/2022 100  Final   01/05/2022 107 (H) 70 - 105 Final   02/27/2021 94  Final          Problems Addressed Today  Encounter Diagnoses   Name Primary?   ? PVD (peripheral vascular disease) (HCC) Yes   ? Left bundle branch block (LBBB)    ? Abnormal stress test    ? Atrial fibrillation, unspecified type (HCC)    ? Type 2 diabetes mellitus with other specified complication, unspecified whether long term insulin use (HCC)        Assessment and Plan     Hemodynamics are clinically stable.  Has been compliant tolerant medications.  At this time working to transition to Xarelto because of affordability through his insurance coverage from the Eliquis.  He has the upcoming coronary angiograms and possible PCI.  Clinically he is stable to proceed the procedure is planned, holding Eliquis and adjustments to his medications were given to him verbally as well as with written instruction.  He can contact our office with any questions or concerns.  Follow-up will be otherwise as previously planned.         Current Medications (including today's revisions)  ? apixaban (ELIQUIS) 5 mg tablet Take one tablet by mouth twice daily.   ? carvediloL (COREG) 6.25 mg tablet Take one tablet by mouth twice daily with meals. Take with food.   ? cephalexin (KEFLEX) 500 mg capsule    ? chlorthalidone (HYGROTON) 25 mg tablet Take one-half tablet by mouth daily.   ? ciclopirox (LOPROX) 0.77 % topical cream Apply  topically to affected area as Needed.   ? cilostazoL (PLETAL) 50 mg tablet Take one tablet by mouth twice daily.   ? gabapentin (NEURONTIN) 800 mg tablet Take one tablet by mouth three  times daily. Patient takes 800mg  AM, 400mg  at noon, and 800mg  PM   ? magnesium oxide 400 mg magnesium capsule Take one capsule by mouth daily.   ? metFORMIN (GLUCOPHAGE) 500 mg tablet Take one tablet by mouth twice daily.   ? pantoprazole DR (PROTONIX) 40 mg tablet Take one tablet by mouth twice daily.   ? pravastatin (PRAVACHOL) 80 mg tablet Take one tablet by mouth daily.   ? spironolactone (ALDACTONE) 25 mg tablet Take one-half tablet by mouth daily.   ? valsartan (DIOVAN) 320 mg tablet Take one tablet by mouth daily.

## 2022-02-16 NOTE — Patient Instructions
Hold eliquis per pre heart catheterization instructions  No change to other medications  Follow up as previously scheduled at this time

## 2022-02-16 NOTE — Progress Notes
An application has been submitted to [employer] for Eliquis.    Tamy Accardo  Medication Assitance Coordinator  [id number]

## 2022-02-18 ENCOUNTER — Encounter: Admit: 2022-02-18 | Discharge: 2022-02-18 | Payer: MEDICARE

## 2022-02-18 MED ORDER — RIVAROXABAN 20 MG PO TAB
20 mg | ORAL_TABLET | Freq: Every day | ORAL | 1 refills | 30.00000 days | Status: AC
Start: 2022-02-18 — End: ?

## 2022-02-18 NOTE — Telephone Encounter
-----   Message from Altamease Oiler, MD sent at 01/21/2022  3:09 PM CDT -----  Regarding: RE: ekg only  Oh man.  We absolutely need to get him on anticoagulation as soon as possible.  We can try and see if rivaroxaban is affordable.  If not then initiate warfarin INR goal 2-3.  What were his heart rates on ECG today?  ----- Message -----  From: Weston Brass  Sent: 01/21/2022   1:19 PM CDT  To: Altamease Oiler, MD  Subject: ekg only                                         Pt here for ekg only today.  Appears to be still in afib.  Pt did no Echo, stress and monitor as outlined in last OV.  Pt had not started anticoagulation due to cost.  Given 30 days free of eliquis.  States eliquis cost prohibitive.  Are you ok with xarelto 20mg  daily for him or coumadin?  He is sched for follow up in Oct.  Does he need sooner or is that dependent on testing?    Thanks  Nov

## 2022-02-18 NOTE — Telephone Encounter
New script to pharmacy.  Called pt left a detailed message.

## 2022-02-23 ENCOUNTER — Encounter: Admit: 2022-02-23 | Discharge: 2022-02-23 | Payer: MEDICARE

## 2022-02-23 NOTE — Telephone Encounter
Pt given eliquis assistance line states issues with affording medication.

## 2022-02-24 ENCOUNTER — Encounter: Admit: 2022-02-24 | Discharge: 2022-02-24 | Payer: MEDICARE

## 2022-02-24 NOTE — Progress Notes
MEDICATION ASSISTANCE PROGRAM (MAP)  MANUFACTURER SUPPLIED MEDICATION    Drug: Eliquis  Manufacturer Program: [employer]  Status: Approved  Enrollment Period: 02/23/2022-07/18/2022  MAP or Drug Company to Dispense: Drug company  Refill Info: Pt to call [phone] for refills    Notes: Patient eligibility is based off insurance status and/or dx and patient must continue to meet all criteria to remain in the program. Please notify the MAP Program of any changes.     Tiane Szydlowski  Medication Assitance Coordinator  [id number]

## 2022-03-15 ENCOUNTER — Encounter: Admit: 2022-03-15 | Discharge: 2022-03-15 | Payer: MEDICARE

## 2022-03-15 DIAGNOSIS — R9430 Abnormal result of cardiovascular function study, unspecified: Secondary | ICD-10-CM

## 2022-03-15 MED ADMIN — SODIUM CHLORIDE 0.9 % IV SOLP [27838]: 500 mL | INTRAVENOUS | @ 15:00:00 | Stop: 2022-03-15 | NDC 00338004904

## 2022-03-15 MED ADMIN — SODIUM CHLORIDE 0.9 % IV SOLP [27838]: 500 mL | INTRAVENOUS | @ 14:00:00 | Stop: 2022-03-15 | NDC 00338004904

## 2022-03-17 ENCOUNTER — Encounter: Admit: 2022-03-17 | Discharge: 2022-03-17 | Payer: MEDICARE

## 2022-03-17 NOTE — Progress Notes
Cardiac Navigation Intake Assessment Document      LAAO Operator:  Dr. Riley Nearing    Patient Name:  Cole Fischer  MRN:  [id number]  DOB:  Jun 20, 1956  Insurance:   Payor: MEDICARE / Plan: MEDICARE PART A AND B / Product Type: Medicare /   Primary contact for patient: self    Appointment Info:   Future Appointments   Date Time Provider Department Center   04/14/2022 10:00 AM Julienne Kass, MD [facility] C[facility] Exam   05/04/2022  2:20 PM Love, Teresita Madura, MD [facility] Exam       Diagnosis and Reason for Visit:  Afib/Watchman  cost (possible compliance issue if pt is unable to afford medication)    Physician Info:   ? Referring Provider:   Feliberto Gottron APRN  ? Cardiologist:   Dr. Sandria Manly  ? EP:   n/a  ? PCP:   Dr. Mike Gip  ? GI:   n/a  ? Neuro:   n/a  ? Other:   n/a    LAAO Indication:   Poor Compliance with OAC therapy - possibly if he cannot afford medication    CHA2DS2VASc Score:     HTN (1), DM (1) and 65-74 (1)  CHA2DS2VASc Yearly Stroke Risk :    3=3.2%    HAS-BLED Score:    HTN (1) and Elderly >65 (1)  HAS-BLED Yearly Risk:    2=1.88%    LAAO Exclusion Criteria:   n/a    Current Anticoagulation:   Eliquis (Apixaban)    Current Antiplatelet:   None    Able to take Aspirin:  YES  Able to take Plavix:  YES  Able to take short term Warfarin:  YES    History of Present Illness:      Atrial Fibrillation Classification: Paroxysmal       AF Treatment Medication Mgmt         Device none                NYHA Class:   n/a    Obstructive Sleep Apnea:  NO  Treatment:   n/a        Medical history (pertinent to LAAO workup) :  Afib (diagnosed 12/2021), HTN, HLD, DM II, PVD      Surgery/Procedure history (pertinent to LAAO workup):  n/a      Last Echo:  02/09/2022  Interpretation Summary    ? There is dyssynchrony of contraction of the left ventricle due to bundle branch block  ? Overall ejection fraction appears to be reasonably preserved between 50 and 55%  ? Normal right ventricular chamber size and function  ? The right atrium appears to be minimally dilated  ? There is no obvious valvular abnormality identified  ? No obvious pericardial effusion       Last TTE:  08/23/2019 - [place] - requesting images       Last CCTA:  none           NEEDS Assessment:                             ? Social Work/Financial:  Financial concerns - will not be able to afford NOAC long term                      ? Physical:  No needs identified                                  ?  Communication:  No needs identified      Plan:  Patient is scheduled for Watchman consult with Dr. Riley Nearing at our [facility] clinic.    Comments:  Patient updated on current plan. Determined the best date for an appointment. Educated to all appointment information.

## 2022-04-14 ENCOUNTER — Encounter: Admit: 2022-04-14 | Discharge: 2022-04-14 | Payer: MEDICARE

## 2022-04-14 ENCOUNTER — Ambulatory Visit: Admit: 2022-04-14 | Discharge: 2022-04-15 | Payer: MEDICARE

## 2022-04-14 DIAGNOSIS — I739 Peripheral vascular disease, unspecified: Secondary | ICD-10-CM

## 2022-04-14 DIAGNOSIS — R9430 Abnormal result of cardiovascular function study, unspecified: Secondary | ICD-10-CM

## 2022-04-14 DIAGNOSIS — E782 Mixed hyperlipidemia: Secondary | ICD-10-CM

## 2022-04-14 DIAGNOSIS — I480 Paroxysmal atrial fibrillation: Secondary | ICD-10-CM

## 2022-04-14 DIAGNOSIS — Z9189 Other specified personal risk factors, not elsewhere classified: Secondary | ICD-10-CM

## 2022-04-14 DIAGNOSIS — I10 Essential (primary) hypertension: Secondary | ICD-10-CM

## 2022-04-14 NOTE — Progress Notes
CARDIOLOGY CONSULT PROGRESS NOTE    Cole Fischer             Admission Date: (Not on file)    Today's Date: 04/14/22  0         Events  Hemodynamic parameters: Normal blood pressure and heart rate noted in our office today.     Subjective:     No major bleeding complications but reports multiple skin bruising episodes    Eliquis is too expensive for him and cost him about $350 per month and Xarelto cost him $400 per month  Adherence is good                Assessment & Recs     1.  Longstanding persistent atrial fibrillation  2.  Benign essential hypertension  3.  High bleeding risk  4.  High stroke risk  5.  Type 2 diabetes mellitus  6.  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 He completed a six week treatment course of IV daptomycin for this issue on 11 March 2022.  Incidentally, he was noted to have asymptomatic atrial fibrillation and was placed on Eliquis full therapeutic dose.  It cost him $300 per month and hence we tried to Xarelto which also cost him $400 a month.  He is not interested in pursuing Coumadin at this juncture.  No major bleeding complications identified.  No TIA or stroke in the past.  No prior transseptal procedures or DVT/PE.  He states that affording these medications is continuing to cause significant financial constraints.He has seen both Dr. Sandria Manly as well as Dr. Harvel Ricks from our clinic.    Recommendations    1.  He certainly has an elevated bleeding risk as well as a significantly elevated stroke risk.  Unfortunately, both oral anticoagulation regimens are very expensive for him and he states that these were significantly concerning to continue in the long-term.  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  He even showed me the sharp objects which he uses for work in our office visit.  Overall, he is certainly a good candidate for short-term oral anticoagulation but a poor long-term oral anticoagulation candidate.  He would like to discuss with his wife before proceeding with Watchman procedure.  2.  Shared decision was made between the patient, myself in regards to alternatives to long-term oral anticoagulation given the risks involved above and placing him on long-term oral anticoagulation.  Risk benefits and alternatives of the procedure were explained to the patient who verbalized understanding of these conditions and consented to proceed with transcatheter left atrial appendage occlusion with Watchman device.  3.  No CTA or TEE available in the system  4.  We will obtain CTA with contrast if possible before the procedure  5.  Procedure will be done under TEE/general anesthesia.  6.  I did look up the infectious diseases note from [facility] in care everywhere from September 2023.  He has completed 6 weeks of daptomycin for a stump infection and has been deemed infection free.  He is on cephalexin maintenance  dose currently and hence cannot safely proceed with Watchman device placement.    Shared Decision Making Note for Transcatheter Left Atrial Appendage Closure [facility]) for Non-Valvular Atrial Fibrillation    This patient is being referred to the [facility] at the [facility] system for evaluation for Left Atrial Appendage Closure [facility]) for management of stroke risk resulting from non-valvular atrial fibrillation (nonvalvular atrial fibrillation constitutes atrial fibrillation in the absence of moderate to severe mitral stenosis or any mechanical valve based on 2019 focused update on the management of atrial fibrillation from ACC/AHA)    Based on their past history and an evidence based tool, it has been determined that they are poor candidates for long-term oral-anticoagulation, however may be tolerant of short term treatment with warfarin or other direct acting oral anticoagulants as necessary.     Risk, benefits, alternatives of the procedure (technique of transcatheter left atrial appendage occlusion, need for general anesthesia, procedure related complications including transseptal related complications, intraprocedural stroke, bleeding, pericardial effusion, perforation needing cardiac surgery etc. and in addition postprocedural care which includes oral anticoagulation/antiplatelet therapy for up to 6 months, repeat transesophageal echocardiography for follow-up assessment of DART/leaks of clinical significance etc. )were explained to the patient who verbalized understanding of these conditions and consented to the procedure.  I have also explained to the patient the incidence of device related thrombosis, clinically significant peridevice leaks communicating with the appendage with the possibility of prolonging oral antiplatelet/anticoagulation.  The patient verbalized understanding of these conditions and consented to the procedure.     Their individual CHA2DS2-VASc stroke risk score, based on past history is  indicated below:      The patient's current CHA2DS2-VASC score is: Hypertension, age above 29, diabetes, vascular disease: Total 4    The patient's high bleeding risk on anticoagulation has been based upon the following clinical factors:          Current HAS-BLED score of the patient is hypertension, medications-cilostazol on board additional antiplatelet therapy, abnormal renal function-1: Total 3    We have discussed their unique stroke and bleeding risk both on and off oral-anticoagulation, and the rationale for this referral for transcatheter left atrial appendage closure.     Based on both stroke and bleeding risk, a shared decision has been made to pursue transcatheter closure of the left atrial appendage as a safe and effective alternative to oral anticoagulant therapy for stroke prevention and to reduce their long term risk of incidence of intra cerebral bleeding.    Jonell Cluck, MD, [facility], [facility]  Interventional Cardiologist           Medications    Current Outpatient Medications:   ?  apixaban (ELIQUIS) 5 mg tablet, Take one tablet by mouth twice daily., Disp: , Rfl:   ?  atorvastatin (LIPITOR) 40 mg tablet, Take one tablet by mouth daily., Disp: 90 tablet, Rfl: 3  ?  carvediloL (COREG) 6.25 mg tablet, Take one tablet by mouth twice daily with meals. Take with food., Disp: 180 tablet, Rfl: 3  ?  cephalexin (KEFLEX) 500 mg capsule, , Disp: , Rfl:   ?  chlorthalidone (HYGROTON) 25 mg tablet, Take one-half tablet by mouth daily., Disp: 90 tablet, Rfl: 1  ?  ciclopirox (LOPROX) 0.77 % topical cream, Apply  topically to affected area as Needed., Disp: , Rfl:   ?  cilostazoL (PLETAL) 50 mg tablet, Take one tablet by mouth twice daily., Disp: , Rfl:   ?  cyclobenzaprine (  FLEXERIL) 10 mg tablet, Take one tablet by mouth three times daily as needed., Disp: , Rfl:   ?  gabapentin (NEURONTIN) 800 mg tablet, Take one tablet by mouth three times daily. Patient takes 800mg  AM, 400mg  at noon, and 800mg  PM, Disp: , Rfl:   ?  HYDROcodone/acetaminophen (NORCO) 7.5/325 mg tablet, TAKE 1 TABLET BY MOUTH TWICE DAILY AS NEEDED FOR PAIN TAKE WITH FOOD SPARINGLY, Disp: , Rfl:   ?  magnesium oxide 400 mg magnesium capsule, Take one capsule by mouth daily., Disp: 14 capsule, Rfl: 0  ?  metFORMIN (GLUCOPHAGE) 500 mg tablet, Take one tablet by mouth twice daily., Disp: , Rfl:   ?  pantoprazole DR (PROTONIX) 40 mg tablet, Take one tablet by mouth twice daily., Disp: , Rfl:   ?  spironolactone (ALDACTONE) 25 mg tablet, Take one-half tablet by mouth daily., Disp: 45 tablet, Rfl: 3  ?  valsartan (DIOVAN) 320 mg tablet, Take one tablet by mouth daily., Disp: , Rfl:     Review Of Systems  None besides facts mentioned above      Physical Exam                          Vital Signs: Most Recent   Vitals:    04/14/22 0947   BP: 110/70   BP Source: Arm, Left Upper   Pulse: 88   O2 Device: None (Room air)   PainSc: Two   Weight: 116.9 kg (257 lb 12.8 oz)   Height: 193 cm (6' 4)          Body mass index is 31.38 kg/m?Marland Kitchen    General Appearance: moderately overweight, no distress   Skin: warm, no ulcers or xanthomas; few ecchymoses   Eyes: conjunctivae and lids normal, pupils are equal and round   Neck Veins: normal JVP , neck veins are not distended   Thyroid: no nodules, masses, tenderness or enlargement   Cardiovascular system: Pulse 86/min, regular rhythm , normal volume, no specific character, felt equally in all peripheries and there was no radio femoral delay.  PMI undisplaced, no palpable thrills/heaves, S1 S2 heard, no murmurs, no rub, no jugular venous distension, no carotid bruit.  Pedal Pulses: normal symmetric pedal pulses   Carotid Arteries: normal carotid upstroke bilaterally, no bruits   Respiratory system: No acute distress  No use of accessory muscles  Normal vesicular breath sounds over all the lung fields bilaterally  No added sounds      Lab/Radiology/Other Diagnostic Tests:    Pertinent labs, imaging,and telemetry reviewed.

## 2022-05-04 ENCOUNTER — Encounter: Admit: 2022-05-04 | Discharge: 2022-05-04 | Payer: MEDICARE

## 2022-05-04 DIAGNOSIS — I10 Essential (primary) hypertension: Secondary | ICD-10-CM

## 2022-05-04 DIAGNOSIS — I447 Left bundle-branch block, unspecified: Secondary | ICD-10-CM

## 2022-05-04 DIAGNOSIS — R9430 Abnormal result of cardiovascular function study, unspecified: Secondary | ICD-10-CM

## 2022-05-04 DIAGNOSIS — I4891 Unspecified atrial fibrillation: Secondary | ICD-10-CM

## 2022-05-07 ENCOUNTER — Encounter: Admit: 2022-05-07 | Discharge: 2022-05-07 | Payer: MEDICARE

## 2022-05-07 NOTE — Progress Notes
Report Sheet    TEE/DCCV           Indication: Atrial fibrillation, unspecified type   Ordering Provider: Love    Name: Cole Fischer     Age: 66 y.o.    DOB: 06/22/1956     MRN: [id number]  Patient phone number: [phone]    Communication Barriers: NA    Lab(s) needed: ECG  Other: NA    Device check needed: No  Device: No results found for: GENERATOR, EPDEVTYP    Other implanted devices/pumps: NA  Has Controller (pt to bring): No    Pt Called: LVM  Arrival Time: 12PM  Driver Information:     Date of Last H&P: 05/04/2022   Additional Information: NA    Prior TEE: NA     Prior DCCV: NA     Prior Echo:02/09/2022       Previous TEE/DCCV details: NA    EF:   ECHO EF   Date Value Ref Range Status   02/09/2022 55 % Final       Anticoag:Eliquis     Dose:5     Frequency:BID     Missed:  Labs   INR    HGB    Platelets    Glucose    NA    K    Creatinine    Other      Medications to HOLD day of:  ? Vitamins/Supplements  ? Metformin, spironolactone    Probe   Gastric Surgery    GERD Yes - protonix   Swallow difficulty    Head/Neck Surg/Rad    Chest Surg/Rad    EGD    Varices/Esophag CA    GI bleed    Dental issues      Sedation   COPD    OSA/CPAP    Asthma    Pulm HTN    Anesthesia Issues    Smoker    ETOH & Frequency    Drug Use    Chronic Pain Med Hydrocodone     Current Medications:   ? apixaban (ELIQUIS) 5 mg tablet Take one tablet by mouth twice daily.   ? atorvastatin (LIPITOR) 40 mg tablet Take one tablet by mouth daily.   ? carvediloL (COREG) 6.25 mg tablet Take one tablet by mouth twice daily with meals. Take with food.   ? cephalexin (KEFLEX) 500 mg capsule    ? chlorthalidone (HYGROTON) 25 mg tablet Take one-half tablet by mouth daily.   ? cilostazoL (PLETAL) 50 mg tablet Take one tablet by mouth twice daily.   ? cyclobenzaprine (FLEXERIL) 10 mg tablet Take one tablet by mouth three times daily as needed.   ? gabapentin (NEURONTIN) 800 mg tablet Take one tablet by mouth three times daily. Patient takes 800mg  AM, 400mg  at noon, and 800mg  PM   ? HYDROcodone/acetaminophen (NORCO) 7.5/325 mg tablet TAKE 1 TABLET BY MOUTH TWICE DAILY AS NEEDED FOR PAIN TAKE WITH FOOD SPARINGLY   ? metFORMIN (GLUCOPHAGE) 500 mg tablet Take one tablet by mouth twice daily.   ? pantoprazole DR (PROTONIX) 40 mg tablet Take one tablet by mouth twice daily.   ? spironolactone (ALDACTONE) 25 mg tablet Take one-half tablet by mouth daily.   ? valsartan (DIOVAN) 320 mg tablet Take one tablet by mouth daily.       Past Medical/Surgical History:   Patient Active Problem List    Diagnosis Date Noted   ? Abnormal cardiac function test 03/15/2022   ? Essential hypertension 08/07/2020   ?  Hyperlipidemia 08/07/2020   ? Type 2 diabetes mellitus (HCC) 08/07/2020   ? Lower extremity edema 08/07/2020   ? Left bundle branch block 08/07/2020   ? PVD (peripheral vascular disease) (HCC) 08/07/2020      Past Medical History:   Diagnosis Date   ? Abnormal cardiac function test 03/15/2022      Surgical History:   Procedure Laterality Date   ? ANGIOGRAPHY CORONARY ARTERY WITH LEFT HEART CATHETERIZATION N/A 03/15/2022    Performed by Hajj, Alfredo Martinez, MD at [facility] EP LAB   ? POSSIBLE PERCUTANEOUS CORONARY STENT PLACEMENT WITH ANGIOPLASTY N/A 03/15/2022    Performed by Hajj, Alfredo Martinez, MD at [facility] EP LAB       Allergies:   Allergies   Allergen Reactions   ? Amlodipine EDEMA     Leg swelling   ? Amoxicillin VOMITING   ? Clavulanic Acid VOMITING     Rennis Golden, RN

## 2022-05-14 ENCOUNTER — Encounter: Admit: 2022-05-14 | Discharge: 2022-05-14 | Payer: MEDICARE

## 2022-05-14 ENCOUNTER — Ambulatory Visit: Admit: 2022-05-14 | Discharge: 2022-05-14 | Payer: MEDICARE

## 2022-05-14 DIAGNOSIS — I10 Essential (primary) hypertension: Secondary | ICD-10-CM

## 2022-05-14 DIAGNOSIS — I447 Left bundle-branch block, unspecified: Secondary | ICD-10-CM

## 2022-05-14 DIAGNOSIS — I4891 Unspecified atrial fibrillation: Secondary | ICD-10-CM

## 2022-05-14 LAB — POC GLUCOSE: POC GLUCOSE: 111 mg/dL — ABNORMAL HIGH (ref 70–100)

## 2022-05-14 MED ORDER — FENTANYL CITRATE (PF) 50 MCG/ML IJ SOLN
12.5-25 ug | INTRAVENOUS | 0 refills | PRN
Start: 2022-05-14 — End: ?

## 2022-05-14 MED ORDER — LIDOCAINE (PF) 20 MG/ML (2 %) IJ SOLN
INTRAVENOUS | 0 refills | Status: DC
Start: 2022-05-14 — End: 2022-05-14

## 2022-05-14 MED ORDER — PHENYLEPHRINE 40 MCG/ML IN NS IV DRIP (STD CONC)
INTRAVENOUS | 0 refills | Status: DC
Start: 2022-05-14 — End: 2022-05-14
  Administered 2022-05-14 (×2): .5 ug/kg/min via INTRAVENOUS

## 2022-05-14 MED ORDER — SODIUM CHLORIDE 0.9 % IV SOLP (OR) 500ML
INTRAVENOUS | 0 refills | Status: DC
Start: 2022-05-14 — End: 2022-05-14

## 2022-05-14 MED ORDER — PHENYLEPHRINE HCL IN 0.9% NACL 1 MG/10 ML (100 MCG/ML) IV SYRG
INTRAVENOUS | 0 refills | Status: DC
Start: 2022-05-14 — End: 2022-05-14

## 2022-05-14 MED ORDER — DIPHENHYDRAMINE HCL 50 MG/ML IJ SOLN
25 mg | Freq: Once | INTRAVENOUS | 0 refills | PRN
Start: 2022-05-14 — End: ?

## 2022-05-14 MED ORDER — PROPOFOL INJ 10 MG/ML IV VIAL
INTRAVENOUS | 0 refills | Status: DC
Start: 2022-05-14 — End: 2022-05-14

## 2022-05-14 MED ORDER — ONDANSETRON HCL (PF) 4 MG/2 ML IJ SOLN
4 mg | Freq: Once | INTRAVENOUS | 0 refills | PRN
Start: 2022-05-14 — End: ?

## 2022-05-14 MED ORDER — PROPOFOL 10 MG/ML IV EMUL 20 ML (INFUSION)(AM)(OR)
INTRAVENOUS | 0 refills | Status: DC
Start: 2022-05-14 — End: 2022-05-14
  Administered 2022-05-14: 19:00:00 125 ug/kg/min via INTRAVENOUS

## 2022-05-14 NOTE — Patient Instructions
CARDIOLOGY PROCEDURES           POST SEDATION INSTRUCTIONS      Patient Name: SAID RUEB  MRN#: [id number]  Date: 05/14/2022      Please follow the instructions listed below:    The following day you may experience a minor sore throat.    Please have someone accompany you, as YOU SHOULD NOT drive or operate machinery for at least 12-24 hours following the procedure.    There may be some residual effects from the sedatives during the procedure.  Do not drive a vehicle for up to 24 hours after receiving sedation.  Do not operate heavy or potentially harmful equipment  Do not make legally binding decisions  Do not drink alcohol for up to 24 hours  Do not communicate through social media for 24 hours.    Other instructions: You may resume your diet as tolerated.     If you have question or concerns about this procedure, please contact the Cardiology office at [phone], and ask to speak to one of the nurses.      Current Medications List:   apixaban (ELIQUIS) 5 mg tablet Take one tablet by mouth twice daily.    atorvastatin (LIPITOR) 40 mg tablet Take one tablet by mouth daily.    carvediloL (COREG) 6.25 mg tablet Take one tablet by mouth twice daily with meals. Take with food.    cephalexin (KEFLEX) 500 mg capsule     chlorthalidone (HYGROTON) 25 mg tablet Take one-half tablet by mouth daily.    cilostazoL (PLETAL) 50 mg tablet Take one tablet by mouth twice daily.    cyclobenzaprine (FLEXERIL) 10 mg tablet Take one tablet by mouth three times daily as needed.    gabapentin (NEURONTIN) 800 mg tablet Take one tablet by mouth three times daily. Patient takes 800mg  AM, 400mg  at noon, and 800mg  PM    HYDROcodone/acetaminophen (NORCO) 7.5/325 mg tablet TAKE 1 TABLET BY MOUTH TWICE DAILY AS NEEDED FOR PAIN TAKE WITH FOOD SPARINGLY    metFORMIN (GLUCOPHAGE) 500 mg tablet Take one tablet by mouth twice daily.    pantoprazole DR (PROTONIX) 40 mg tablet Take one tablet by mouth twice daily.    spironolactone (ALDACTONE) 25 mg tablet Take one-half tablet by mouth daily.    valsartan (DIOVAN) 320 mg tablet Take one tablet by mouth daily.         Instructions Given To:     Instructions Given By: Grant Ruts, RN

## 2022-05-14 NOTE — Progress Notes
To our valued patient,     We have enrolled your heart monitor and requested it to be mailed to your home.  You should receive this within 2-3 business days. Please wear the monitor for 30 days. When you have completed the study, please remove the device, and mail it back to the company. Please call [employer] Customer Service at [phone] or [email or web address] with questions about placement, troubleshooting, and insurance coverage. You can reach the ambulatory heart monitor team at [phone].        ? Please write your NAME, PHYSICIAN, START DATE/TIME on the diary.    ? Prep skin by shaving and ensuring there is no lotion on the chest.  Gently abrade for clear ecgs.  ? Showering is okay, but best to shower with back to the water.    ? Please use the diary for symptoms - specific date and times and what you are feeling.  ? Please return the device in the mailer with diary promptly after completing the study.        Your Heart Rhythm Management Team  Cardiovascular Medicine Department at [facility]              Ambulatory (External) Cardiac Monitor Enrollment Record     Placement Location: Home Enrollment  Clinic Location: [facility]  Vendor: [employer] ([employer])  Mobile Cardiac Telemetry (MCOT/MCT)?: Yes  Duration of Monitor (in days): 30  Monitor Diagnosis: Other (I44.7 Left bundle-branch block, unspecified)  Secondary Monitor Diagnosis: Atrial Fibrillation (I48.91)  Ordering Provider: Allen Kell, MD  AMB Monitor Serial Number: home  No data recorded    Start Time and Date: 05/14/22 7:19 AM   Patient Name: Cole Fischer  DOB: [birth year]  MRN: [id number]  Sex: male  Mobile Phone Number: [phone] (mobile)  Home Phone Number: [phone]  Patient Address: [address] [place] [address]  Insurance Coverage: MEDICARE PART A AND B  Insurance ID: [id number]  Insurance Group #:   Insurance Subscriber: Steve,Lyn R  Implanted Cardiac Device Information: No results found for: EPDEVTYP      Patient instructed to contact company phone number on the monitor box with questions regarding billing, placement, troubleshooting.     Dorena Dew    ____________________________________________________________    Clinic Staff:    ? Complete additional steps for documentation double check/Co-Sign.  ? In Follow-up, send chart upon closing encounter to P [facility] HRM AMBULATORY MONITORS    HRM Ambulatory Monitoring Team:  1. Schedule on appropriate template and check-in.   Clinic Placement Schedule on clinic location [facility] schedule   Home Enrollment Schedule on Home Enrollment schedule ([facility] RHYTHM)   Given to patient in clinic for self-placement Schedule on Home Enrollment schedule ([facility] HRT RHYTHM)   Inpatient Schedule on [facility] [place] [facility] AMBULATORY MONITORING template   2. Please enroll with appropriate vendor.

## 2022-05-14 NOTE — Progress Notes
Pre-Operative Assessment for TEE or Cardioversion    Date of Service:  05/14/2022    Cole Fischer is a 66 y.o. y.o. male. With significant h/o right above the knee amputation, HTN, HLD, left BBB, type 2 DM and persistent atrial fibrillation.  He is referred for TEE and Cardioversion Indication: atrail fibrillation.   .      He  has been compliant with his  apixaban (Eliquis)  5 mg BID.    Probe assessment  is positive for: GERD   Sedation assessment is positive for: Chronic Pain Meds.      Chest pain:  No   SOB: winded at times         Medical History:  Medical History:   Diagnosis Date   ? Abnormal cardiac function test 03/15/2022        Surgical History:   Surgical History:   Procedure Laterality Date   ? ANGIOGRAPHY CORONARY ARTERY WITH LEFT HEART CATHETERIZATION N/A 03/15/2022    Performed by Hajj, Alfredo Martinez, MD at [facility] EP LAB   ? POSSIBLE PERCUTANEOUS CORONARY STENT PLACEMENT WITH ANGIOPLASTY N/A 03/15/2022    Performed by Hajj, Alfredo Martinez, MD at [facility] EP LAB       Social History     Social History     Tobacco Use   ? Smoking status: Former     Types: Cigarettes     Quit date: 2017     Years since quitting: 6.8   ? Smokeless tobacco: Never   Vaping Use   ? Vaping Use: Never used   Substance Use Topics   ? Alcohol use: Not Currently   ? Drug use: Never         Allergies                                        Allergies   Allergen Reactions   ? Amlodipine EDEMA     Leg swelling   ? Amoxicillin VOMITING   ? Clavulanic Acid VOMITING          Current Medications  Current Outpatient Medications on File Prior to Encounter   Medication Sig Dispense Refill   ? apixaban (ELIQUIS) 5 mg tablet Take one tablet by mouth twice daily.     ? atorvastatin (LIPITOR) 40 mg tablet Take one tablet by mouth daily. 90 tablet 3   ? carvediloL (COREG) 6.25 mg tablet Take one tablet by mouth twice daily with meals. Take with food. 180 tablet 3   ? cephalexin (KEFLEX) 500 mg capsule      ? chlorthalidone (HYGROTON) 25 mg tablet Take one-half tablet by mouth daily. 90 tablet 1   ? cilostazoL (PLETAL) 50 mg tablet Take one tablet by mouth twice daily.     ? cyclobenzaprine (FLEXERIL) 10 mg tablet Take one tablet by mouth three times daily as needed.     ? gabapentin (NEURONTIN) 800 mg tablet Take one tablet by mouth three times daily. Patient takes 800mg  AM, 400mg  at noon, and 800mg  PM     ? HYDROcodone/acetaminophen (NORCO) 7.5/325 mg tablet TAKE 1 TABLET BY MOUTH TWICE DAILY AS NEEDED FOR PAIN TAKE WITH FOOD SPARINGLY     ? metFORMIN (GLUCOPHAGE) 500 mg tablet Take one tablet by mouth twice daily.     ? pantoprazole DR (PROTONIX) 40 mg tablet Take one tablet by mouth twice daily.     ?  spironolactone (ALDACTONE) 25 mg tablet Take one-half tablet by mouth daily. 45 tablet 3   ? valsartan (DIOVAN) 320 mg tablet Take one tablet by mouth daily.       No current facility-administered medications on file prior to encounter.       Vitals  Estimated body mass index is 31.04 kg/m? as calculated from the following:    Height as of 05/04/22: 193 cm (6' 4).    Weight as of 05/04/22: 115.7 kg (255 lb).       Patient appears alert and oriented: Yes  NPO: for greater than 8 hours  Inpatient IV status: new IV started into Right AC 20    Diagnostic Tests  White Blood Cells   Date Value Ref Range Status   03/15/2022 9.4 4.5 - 11.0 K/UL Final     Hemoglobin   Date Value Ref Range Status   03/15/2022 15.3 13.5 - 16.5 GM/DL Final     Hematocrit   Date Value Ref Range Status   03/15/2022 43.8 40 - 50 % Final     Platelet Count   Date Value Ref Range Status   03/15/2022 180 150 - 400 K/UL Final     Sodium   Date Value Ref Range Status   03/15/2022 139 137 - 147 MMOL/L Final     Potassium   Date Value Ref Range Status   03/15/2022 4.1 3.5 - 5.1 MMOL/L Final     Magnesium   Date Value Ref Range Status   01/05/2022 1.4 (L) 1.6 - 2.6 Final     Blood Urea Nitrogen   Date Value Ref Range Status   03/15/2022 28 (H) 7 - 25 MG/DL Final     Creatinine   Date Value Ref Range Status   03/15/2022 1.43 (H) 0.4 - 1.24 MG/DL Final     Glucose   Date Value Ref Range Status   03/15/2022 125 (H) 70 - 100 MG/DL Final       Last MAC INR Flow Sheet Entry:    Last recorded Lab results:   No results found for: INR  No results found for: PTT        Blood Cultures  Resulted Micro Last 72 Hrs    No results found         Last TEE date: None  Last Cardioversion date: None  Echo procedures within the past 30 days:  No results found.      Device Information on File  No results found for: GENERATOR, EPDEVTYP      Additional Comments:  step daughter is his ride home Tish  [phone]    Plan:  Dr. Kevin Fenton will plan to proceed with the  TEE and Cardioversion.

## 2022-05-14 NOTE — Anesthesia Pre-Procedure Evaluation
Anesthesia Pre-Procedure Evaluation    Name: Cole Fischer      MRN: [id number]     DOB: Apr 07, 1956     Age: 66 y.o.     Sex: male   _________________________________________________________________________     Procedure Info: TEE and Cardioversion 05/14/22  Procedure Information     Date/Time: 05/14/22 1300    Scheduled providers: Rickey Primus, MD; Hilbert Corrigan, RN    Procedure: TRANSESOPHAGEAL ECHO    Location: Cardiovascular Medicine: [facility]        Past Medical History:        Patient Active Problem List   ? Diagnosis Date Noted   ? Abnormal cardiac function test 03/15/2022   ? Essential hypertension 08/07/2020   ? Hyperlipidemia 08/07/2020   ? Type 2 diabetes mellitus (HCC) 08/07/2020   ? Lower extremity edema 08/07/2020   ? Left bundle branch block 08/07/2020   ? ? -08/23/19- MPI- Normal global LV function. EF 61%. Medium defect of mild to moderate intensity during stress. Fixed perfusion abnormality in the mid anterior, apical anterior, and septal and apex segments consistent with attenuation and artifact. Negative stress test for evidence of pharmacologically induced ischemia.   -08/23/19 Echo- EF 55%  ?         Physical Assessment  Vital Signs (last filed in past 24 hours):         Patient History   Allergies   Allergen Reactions   ? Amlodipine EDEMA     Leg swelling   ? Amoxicillin VOMITING   ? Clavulanic Acid VOMITING        Current Medications    Medication Directions   apixaban (ELIQUIS) 5 mg tablet Take one tablet by mouth twice daily.   atorvastatin (LIPITOR) 40 mg tablet Take one tablet by mouth daily.   carvediloL (COREG) 6.25 mg tablet Take one tablet by mouth twice daily with meals. Take with food.   cephalexin (KEFLEX) 500 mg capsule    chlorthalidone (HYGROTON) 25 mg tablet Take one-half tablet by mouth daily.   cilostazoL (PLETAL) 50 mg tablet Take one tablet by mouth twice daily.   cyclobenzaprine (FLEXERIL) 10 mg tablet Take one tablet by mouth three times daily as needed.   gabapentin (NEURONTIN) 800 mg tablet Take one tablet by mouth three times daily. Patient takes 800mg  AM, 400mg  at noon, and 800mg  PM   HYDROcodone/acetaminophen (NORCO) 7.5/325 mg tablet TAKE 1 TABLET BY MOUTH TWICE DAILY AS NEEDED FOR PAIN TAKE WITH FOOD SPARINGLY   metFORMIN (GLUCOPHAGE) 500 mg tablet Take one tablet by mouth twice daily.   pantoprazole DR (PROTONIX) 40 mg tablet Take one tablet by mouth twice daily.   spironolactone (ALDACTONE) 25 mg tablet Take one-half tablet by mouth daily.   valsartan (DIOVAN) 320 mg tablet Take one tablet by mouth daily.       Review of Systems/Medical History      Patient summary reviewed  Nursing notes reviewed  Pertinent labs reviewed    PONV Screening: Non-smoker  No history of anesthetic complications  No family history of anesthetic complications      Airway - negative        Pulmonary          Previous smoker, quit 2017      Cardiovascular         Exercise tolerance: <4 METS       Beta Blocker therapy: Yes      Hypertension,  Coronary artery disease (Cardiac cath 02/2022: mod nonobstructive disease in mid L ant descending artery)            Dysrhythmias; atrial fibrillation      PVD      Hyperlipidemia      GI/Hepatic/Renal           GERD,           Neuro/Psych - negative        Musculoskeletal         Previous AKA of RLE d/t necrotizing fasciitis        Endocrine/Other       Diabetes, type 2          Obesity      Constitution - negative       Physical Exam    Airway Findings      Mallampati: II      TM distance: >3 FB      Neck ROM: full      Mouth opening: good    Dental Findings:         Comments: edentulous    Cardiovascular Findings:       Rhythm: irregular      Rate: normal      Comments: A Fib    Pulmonary Findings:       Breath sounds clear to auscultation.    Abdominal Findings:       Obese    Neurological Findings:       Alert and oriented x 3    Constitutional findings:       No acute distress      Well-developed      Well-nourished Previous Airway Procedure Notes Displaying the 3 most recent records   No records found.         Patient Lines/Drains/Airways Status     Active Lines:     None              Diagnostic Tests  Hematology:   Lab Results   Component Value Date    HGB 15.3 03/15/2022    HCT 43.8 03/15/2022    PLTCT 180 03/15/2022    WBC 9.4 03/15/2022    MCV 90.1 03/15/2022    MCH 31.5 03/15/2022    MCHC 34.9 03/15/2022    MPV 8.5 03/15/2022    RDW 13.8 03/15/2022         General Chemistry:   Lab Results   Component Value Date    NA 139 03/15/2022    K 4.1 03/15/2022    CL 103 03/15/2022    CO2 24 03/15/2022    GAP 12 03/15/2022    BUN 28 03/15/2022    CR 1.43 03/15/2022    GLU 125 03/15/2022    CA 9.8 03/15/2022    ALBUMIN 4.3 02/11/2022    MG 1.4 01/05/2022    TOTBILI 0.56 02/11/2022    PO4 3.6 05/13/2020      Coagulation: No results found for: PT, PTT, INR     EKG 05/04/22:  Atrial fibrillation  Left bundle branch block    ECHO 02/09/22:  ? There is dyssynchrony of contraction of the left ventricle due to bundle branch block  ? Overall ejection fraction appears to be reasonably preserved between 50 and 55%  ? Normal right ventricular chamber size and function  ? The right atrium appears to be minimally dilated  ? There is no obvious valvular abnormality identified  ? No obvious pericardial effusion  MPI Stress Test 02/09/22:  SUMMARY/OPINION:??This study is technically difficult but probably abnormal.  There appeared to be motion and rhythm issues leading to a problematic study.  The left ventricular systolic function appears moderately impaired, the ejection fraction was 35%, though the variable RR could lead to the EF being underestimated.  The perfusion pattern was also technically difficult, for the most part there is a fixed defect involving the LAD territory, there were some mixed components, as well as some reversed redistribution opponents.  This may represent an area of prior injury with some residual ischemia, though the specificity of the finding is definitely decreased.  The pulmonary to myocardial count ratio was normal at 0.35, there is no definite transit ischemic dilatation.  The pharmacologic ECG portion of the study is nondiagnostic for ischemia.  ?  In aggregate the current study is high risk in regards to predicted annual cardiovascular mortality rate, due to the sum stress score of 19.      PAC Plan    Anesthesia Plan    ASA score: 3   Plan: MAC  Induction method: intravenous  NPO status: acceptable      Informed Consent  Anesthetic plan and risks discussed with patient.  Use of blood products discussed with patient  Blood Consent: consented      Plan discussed with: anesthesiologist, CRNA, SRNA and surgeon/proceduralist.      Alerts

## 2022-05-14 NOTE — Anesthesia Post-Procedure Evaluation
Post-Anesthesia Evaluation    Name: Cole Fischer      MRN: [id number]     DOB: 12/21/1955     Age: 66 y.o.     Sex: male   __________________________________________________________________________     Procedure Information     Anesthesia Start Date/Time: 05/14/22 1333    Scheduled providers: Jerel Shepherd, MD; Baltazar Najjar, RN    Procedure: TRANSESOPHAGEAL ECHO    Location: [facility]: [facility]    No vitals data found for the desired time range.        Post Anesthesia Evaluation Note    Evaluation location: Pre/Post  Patient participation: recovered; patient participated in evaluation  Level of consciousness: alert    Pain score: 0  Pain management: adequate    Hydration: normovolemia  Temperature: 36.0C - 38.4C  Airway patency: adequate    Perioperative Events       Post-op nausea and vomiting: no PONV    Postoperative Status  Cardiovascular status: hemodynamically stable  Respiratory status: spontaneous ventilation        Perioperative Events  There were no known notable events for this encounter.

## 2022-06-14 ENCOUNTER — Encounter: Admit: 2022-06-14 | Discharge: 2022-06-14 | Payer: MEDICARE

## 2022-06-19 ENCOUNTER — Encounter: Admit: 2022-06-19 | Discharge: 2022-06-19 | Payer: MEDICARE

## 2022-06-19 NOTE — Telephone Encounter
-----   Message from Lauralee Evener, RN sent at 04/14/2022 11:22 AM CDT -----  Regarding: Possible watchman  Hello,  Dr. Donnie Coffin saw this patient in the [facility] clinic today for [facility] consult. The patient would like to talk with family to make a decision. Would you please call the patient in a couple of days to see if they have any questions and if they have made a decision? Dr. Donnie Coffin would like a CTA prior to procedure if possible. I have not entered those orders since not sure if he is having it. I would be happy to put those in or whatever else is needed if he chooses to have procedure.     Thanks,  Jolyn Nap CVM

## 2022-06-19 NOTE — Telephone Encounter
Called patient to see if he is ready to schedule Watchman.  LM for patient to call.

## 2022-06-23 ENCOUNTER — Encounter: Admit: 2022-06-23 | Discharge: 2022-06-23 | Payer: MEDICARE

## 2022-06-23 NOTE — Telephone Encounter
Results and recommendations called to patient lmom requested call back if questions

## 2022-06-23 NOTE — Telephone Encounter
-----   Message from Altamease Oiler, MD sent at 06/21/2022  4:40 PM CST -----  No atrial fibrillation.  He has about a 9% burden of premature ventricular contractions.  At this point no changes.  Thank you!

## 2022-07-01 ENCOUNTER — Encounter: Admit: 2022-07-01 | Discharge: 2022-07-01 | Payer: MEDICARE

## 2022-07-20 ENCOUNTER — Encounter: Admit: 2022-07-20 | Discharge: 2022-07-20 | Payer: MEDICARE

## 2022-07-22 ENCOUNTER — Encounter: Admit: 2022-07-22 | Discharge: 2022-07-22 | Payer: MEDICARE

## 2022-07-22 DIAGNOSIS — R0989 Other specified symptoms and signs involving the circulatory and respiratory systems: Secondary | ICD-10-CM

## 2022-07-22 DIAGNOSIS — I447 Left bundle-branch block, unspecified: Secondary | ICD-10-CM

## 2022-07-22 DIAGNOSIS — R9430 Abnormal result of cardiovascular function study, unspecified: Secondary | ICD-10-CM

## 2022-07-22 DIAGNOSIS — I4891 Unspecified atrial fibrillation: Secondary | ICD-10-CM

## 2022-07-22 NOTE — Progress Notes
Date of Service: 07/22/2022    Cole Fischer is a 67 y.o. male.       Chief Complaint: Follow-up    History of Present Illness:     I had the pleasure of seeing Cole Fischer back in our [place] office this morning for cardiovascular followup.      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     Cole Fischer was seen by Dr. Riley Nearing on September 27.  They discussed potential watchman implantation.  Due to underlying infection and being on chronic Keflex they opted to hold off on proceeding for the time being.    When I saw Cole Fischer back in October he remained in atrial fibrillation.  We sent him to [facility] for TEE guided DCCV which was successful.  He has maintained sinus rhythm since.     From a symptom standpoint Cole Fischer seems to be getting along okay.  He actually feels quite a bit better now that he is back in sinus rhythm.  Less fatigued and not short of breath.  He denies chest pain or shortness of breath.  No lightheadedness, dizziness, palpitations, orthopnea, paroxysmal nocturnal dyspnea, or lower extremity swelling.    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  Sounds like he is going to require surgical debridement.  He is meeting with the surgeon tomorrow.  He is following with infectious disease from [facility].    We did obtain an ECG in the office today which demonstrates normal sinus rhythm.  Heart rate 66 bpm.  First-degree atrioventricular block.  Left bundle branch block.  QTc 471 ms.       Past Medical History:  Patient Active Problem List    Diagnosis Date Noted    Abnormal cardiac function test 03/15/2022    Essential hypertension 08/07/2020    Hyperlipidemia 08/07/2020    Type 2 diabetes mellitus (HCC) 08/07/2020    Lower extremity edema 08/07/2020    Left bundle branch block 08/07/2020     -08/23/19- MPI- Normal global LV function. EF 61%. Medium defect of mild to moderate intensity during stress. Fixed perfusion abnormality in the mid anterior, apical anterior, and septal and apex segments consistent with attenuation and artifact. Negative stress test for evidence of pharmacologically induced ischemia.   -08/23/19 Echo- EF 55%      PVD (peripheral vascular disease) (HCC) 08/07/2020       Review of Systems   Constitutional: Negative.   HENT: Negative.     Eyes: Negative.    Cardiovascular:  Positive for leg swelling.   Respiratory: Negative.     Endocrine: Negative.    Hematologic/Lymphatic: Negative.    Skin:  Positive for poor wound healing.   Musculoskeletal:  Positive for myalgias.   Gastrointestinal: Negative.    Genitourinary: Negative.    Neurological: Negative.    Psychiatric/Behavioral:  Positive for depression.    Allergic/Immunologic: Negative.        Vitals:    07/22/22 1312   BP: 124/72   BP Source: Arm, Left Upper   Pulse: 70   SpO2: 98%   O2 Device:  None (Room air)   PainSc: Four   Weight: 104.8 kg (231 lb)  Comment: Patient stated current weight today   Height: 193 cm (6' 4)     Body mass index is 28.12 kg/m?Marland Kitchen    Physical Examination:  General Appearance: No acute distress. Fully alert and oriented.  Skin: Warm. No ulcers or xanthomas.   HEENT: Grossly unremarkable. Lips and oral mucosa without pallor or cyanosis. Moist mucous membranes.   Neck Veins: Normal jugular venous pressure. Neck veins are not distended.  Carotid Arteries: Normal carotid upstroke bilaterally. No bruits.  Chest Inspection: Chest is normal in appearance.  Auscultation/Percussion: Normal respiratory effort. Lungs clear to auscultation bilaterally. No wheezes, rales, or rhonchi.    Cardiac Rhythm: Regular rhythm. Normal rate.  Cardiac Auscultation: Normal S1 & S2. No S3 or S4. No rub.  Murmurs: No cardiac murmurs.  Peripheral Circulation: Normal peripheral circulation.   Abdominal Aorta: No abdominal aortic bruit.  Extremities: Appropriately warm to touch. No lower extremity edema.  Abdominal Exam: Soft, non-tender. No masses, no organomegaly. Normal bowel sounds.  Neurologic Exam: Neurological assessment grossly intact.       Assessment and Plan:  Persistent atrial fibrillation: Cole Fischer underwent TEE guided cardioversion in October.  He has remained in sinus rhythm since.  ECG today confirms sinus rhythm.  He feels considerably better now that he is back in sinus rhythm.  Less fatigued and no exertional shortness of breath.  Unfortunately he continues to have trouble affording his apixaban.  He has been working on getting assistance.  We are going to try to get him on rivaroxaban for a more reasonable price.  In the past he is declined warfarin.  If we are unable to get any of the novel oral anticoagulants at a price that he can afford and he is unwilling to start warfarin then I think an alternative option would be to place a implantable looping recorder to monitor for atrial fibrillation.  If he has no recurrence then perhaps we could stay off of anticoagulation.  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  If we were able to get better control of the infection then I think a watchman device would be an excellent option for him.  Coronary artery disease: He had coronary angiogram on 03/15/2022.  Moderate, nonobstructive coronary disease was identified in the left anterior descending artery.  RFR 0.9.  No intervention undertaken.  Medical management recommended.  He is not having any exertional chest discomfort.  Currently on apixaban so we have held off on initiation of aspirin.  Risk factor modification as below.  Hypertension:  Well controlled.  Blood pressure 124/72 mmHg.  His lightheadedness symptoms have improved quite a bit.  Only rare orthostatic symptoms.  Continue current regimen.  Conduction disease:  Cole Fischer has left bundle branch block on ECG.  Coronary angiogram completed 03/15/2022 with moderate, nonobstructive coronary disease in the left anterior descending artery.  Dyslipidemia:  His goal LDL is less than 70.  Most recent fasting lipid panel from 12/30/2021 with LDL of 56.  Continue current dose of pravastatin.   Type 2 diabetes mellitus:  He seems to be doing well on metformin.  He tells me his hemoglobin A1c is well controlled.         Shared Decision-Making interaction regarding the use of anticoagulation in patient with non-valvular Atrial Fibrillation     NYHA Class:   III  HAS-BLED score: HTN (1), Bleeding History (1), Elderly >65 (1) and Current Antiplatelet or NSAID use (1)  HAS-BLED Yearly Risk: 4=8.7%     CHA2DS2-VASc Score: HTN (1), DM (1) and 65-74 (1)  CHA2DSVASc Yearly Stroke Risk: 3=3.2%     LAAO Indication: History of Bleeding or increased risk of bleeding     Based on their past-history it has been determined that they are poor candidates for long-term oral-anticoagulation, however, may be tolerant of short-term treatment with warfarin or other direct acting oral anticoagulants as necessary.      We have discussed their unique stroke and bleeding risk both on and off oral-anticoagulation, and the rationale for this referral for transcatheter left atrial appendage closure. An evidence-based tool (EBT) was provided to patient to ensure that the patients' health goals and preferences were covered.     A copy of the EBT can be accessed thru [facility] system under 'Left Atrial Appendage Occlusion (LAAO) Device Shared Decision-Making Tool.'                       Total time spent on today's office visit was 45 minutes. This includes face-to-face in person visit with patient as well as non face-to-face time including review of the electronic medical record, outside records, labs, radiologic studies, cardiovascular studies, formulation of treatment plan, after visit summary, future disposition, personal discussions, and documentation.     Current Medications (including today's revisions)   apixaban (ELIQUIS) 5 mg tablet Take one tablet by mouth twice daily.    atorvastatin (LIPITOR) 40 mg tablet Take one tablet by mouth daily.    carvediloL (COREG) 6.25 mg tablet Take one tablet by mouth twice daily with meals. Take with food.    cephalexin (KEFLEX) 500 mg capsule      chlorthalidone (HYGROTON) 25 mg tablet Take one-half tablet by mouth daily.    cilostazoL (PLETAL) 50 mg tablet Take one tablet by mouth twice daily.    cyclobenzaprine (FLEXERIL) 10 mg tablet Take one tablet by mouth three times daily as needed.    gabapentin (NEURONTIN) 800 mg tablet Take one tablet by mouth three times daily. Patient takes 800mg  AM, 400mg  at noon, and 800mg  PM    HYDROcodone/acetaminophen (NORCO) 7.5/325 mg tablet TAKE 1 TABLET BY MOUTH TWICE DAILY AS NEEDED FOR PAIN TAKE WITH FOOD SPARINGLY    metFORMIN (GLUCOPHAGE) 500 mg tablet Take one tablet by mouth twice daily.    pantoprazole DR (PROTONIX) 40 mg tablet Take one tablet by mouth twice daily.    spironolactone (ALDACTONE) 25 mg tablet Take one-half tablet by mouth daily.    valsartan (DIOVAN) 320 mg tablet Take one tablet by mouth daily.               Note Details          Total time spent on today's office visit was 35 minutes. This includes face-to-face in person visit with patient as well as non face-to-face time including review of the electronic medical record, outside records, labs, radiologic studies, cardiovascular studies, formulation of treatment plan, after visit summary, future disposition, personal discussions, and documentation.    Current Medications (including today's revisions)   apixaban (ELIQUIS) 5 mg tablet Take one tablet by mouth twice daily.    atorvastatin (LIPITOR) 40 mg tablet Take one tablet by mouth daily.    carvediloL (COREG) 6.25 mg tablet Take one tablet by mouth twice daily with meals. Take with food.  chlorthalidone (HYGROTON) 25 mg tablet Take one-half tablet by mouth daily.    cilostazoL (PLETAL) 50 mg tablet Take one tablet by mouth twice daily.    cyclobenzaprine (FLEXERIL) 10 mg tablet Take one tablet by mouth three times daily as needed.    duloxetine DR (CYMBALTA) 30 mg capsule Take one capsule by mouth daily.    gabapentin (NEURONTIN) 800 mg tablet Take one tablet by mouth three times daily. Patient takes 800mg  AM, 400mg  at noon, and 800mg  PM    HYDROcodone/acetaminophen (NORCO) 7.5/325 mg tablet TAKE 1 TABLET BY MOUTH TWICE DAILY AS NEEDED FOR PAIN TAKE WITH FOOD SPARINGLY    metFORMIN (GLUCOPHAGE) 500 mg tablet Take one tablet by mouth daily.    pantoprazole DR (PROTONIX) 40 mg tablet Take one tablet by mouth twice daily.    spironolactone (ALDACTONE) 25 mg tablet Take one-half tablet by mouth daily.    triamcinolone acetonide (KENALOG) 0.1 % topical cream Apply one g topically to affected area as Needed.    valsartan (DIOVAN) 320 mg tablet Take one tablet by mouth daily.

## 2022-07-28 ENCOUNTER — Encounter: Admit: 2022-07-28 | Discharge: 2022-07-28 | Payer: MEDICARE

## 2022-07-29 ENCOUNTER — Encounter: Admit: 2022-07-29 | Discharge: 2022-07-29 | Payer: MEDICARE

## 2022-07-29 MED ORDER — CHLORTHALIDONE 25 MG PO TAB
12.5 mg | ORAL_TABLET | Freq: Every day | ORAL | 3 refills | Status: AC
Start: 2022-07-29 — End: ?

## 2022-08-05 ENCOUNTER — Encounter: Admit: 2022-08-05 | Discharge: 2022-08-05 | Payer: MEDICARE

## 2022-08-05 MED ORDER — CARVEDILOL 6.25 MG PO TAB
6.25 mg | ORAL_TABLET | Freq: Two times a day (BID) | ORAL | 3 refills | 90.00000 days | Status: AC
Start: 2022-08-05 — End: ?

## 2022-08-10 ENCOUNTER — Encounter: Admit: 2022-08-10 | Discharge: 2022-08-10 | Payer: MEDICARE

## 2022-08-10 NOTE — Telephone Encounter
Request for imaging to be clouded sent to [facility] via [facility].

## 2022-08-12 ENCOUNTER — Encounter: Admit: 2022-08-12 | Discharge: 2022-08-12 | Payer: MEDICARE

## 2022-08-13 ENCOUNTER — Ambulatory Visit: Admit: 2022-08-13 | Discharge: 2022-08-14 | Payer: MEDICARE

## 2022-08-13 ENCOUNTER — Encounter: Admit: 2022-08-13 | Discharge: 2022-08-13 | Payer: MEDICARE

## 2022-08-13 ENCOUNTER — Ambulatory Visit: Admit: 2022-08-13 | Discharge: 2022-08-13 | Payer: MEDICARE

## 2022-08-13 DIAGNOSIS — T879 Unspecified complications of amputation stump: Secondary | ICD-10-CM

## 2022-08-13 DIAGNOSIS — R9430 Abnormal result of cardiovascular function study, unspecified: Secondary | ICD-10-CM

## 2022-08-13 LAB — CBC AND DIFF
ABSOLUTE BASO COUNT: 0 K/UL (ref 0–0.20)
ABSOLUTE EOS COUNT: 0.4 K/UL (ref 0–0.45)
ABSOLUTE LYMPH COUNT: 1.9 K/UL (ref 1.0–4.8)
ABSOLUTE MONO COUNT: 0.6 K/UL (ref 0–0.80)
ABSOLUTE NEUTROPHIL: 6.8 K/UL (ref 1.8–7.0)
BASOPHILS %: 1 % (ref 0–2)
EOSINOPHILS %: 4 % (ref 0–5)
HEMOGLOBIN: 13 g/dL (ref 13.5–16.5)
LYMPHOCYTES %: 19 % — ABNORMAL LOW (ref 24–44)
MCH: 31 pg (ref 26–34)
MCV: 90 FL (ref 80–100)
MONOCYTES %: 7 % (ref 4–12)
PLATELET COUNT: 194 K/UL (ref 150–400)
RBC COUNT: 4.3 M/UL — ABNORMAL LOW (ref 4.4–5.5)
RDW: 14 % (ref >60)

## 2022-08-14 DIAGNOSIS — T879 Unspecified complications of amputation stump: Secondary | ICD-10-CM

## 2022-09-21 ENCOUNTER — Encounter: Admit: 2022-09-21 | Discharge: 2022-09-21 | Payer: MEDICARE

## 2022-09-21 MED ORDER — APIXABAN 5 MG PO TAB
5 mg | ORAL_TABLET | Freq: Two times a day (BID) | ORAL | 1 refills | Status: AC
Start: 2022-09-21 — End: ?

## 2022-09-21 MED ORDER — RIVAROXABAN 20 MG PO TAB
20 mg | ORAL_TABLET | Freq: Every day | ORAL | 1 refills | 30.00000 days | Status: DC
Start: 2022-09-21 — End: 2022-09-21

## 2022-09-21 NOTE — Telephone Encounter
Patient called to report the patient assistance he had for eliquis has expired and he has not received a renewal. Pricing at [employer] in [place] reveals Eliquis and Xarelto to be too costly for the patient (over $300/3 months). Patient declines warfarin. Patient and [facility] discussed at his last OV the possibility having ILR implanted or watchman. Watchman was questionable due to ongoing infection of leg amputation stump. Patient is greatly concerned with prices of procedures due to being off of work for an extended period of time.    Will route to [facility] for his review and recommendations.

## 2022-09-23 ENCOUNTER — Encounter: Admit: 2022-09-23 | Discharge: 2022-09-23 | Payer: MEDICARE

## 2022-09-23 DIAGNOSIS — I4891 Unspecified atrial fibrillation: Secondary | ICD-10-CM

## 2022-09-23 MED ORDER — CLINDAMYCIN HCL 150 MG PO CAP
ORAL_CAPSULE | 0 refills | Status: AC
Start: 2022-09-23 — End: ?

## 2022-09-24 ENCOUNTER — Encounter: Admit: 2022-09-24 | Discharge: 2022-09-24 | Payer: MEDICARE

## 2022-09-24 NOTE — Progress Notes
Medicare Primary No pre-certification is required.

## 2022-10-06 ENCOUNTER — Encounter: Admit: 2022-10-06 | Discharge: 2022-10-06 | Payer: MEDICARE

## 2022-10-06 NOTE — Progress Notes
Application for patient's Eliquis has been submitted to prescriber for signatures.    Layman Gully  Medication Assistance Coordinator   [phone]

## 2022-10-12 ENCOUNTER — Encounter: Admit: 2022-10-12 | Discharge: 2022-10-12 | Payer: MEDICARE

## 2022-10-12 ENCOUNTER — Ambulatory Visit: Admit: 2022-10-12 | Discharge: 2022-10-12 | Payer: MEDICARE

## 2022-10-12 DIAGNOSIS — I4891 Unspecified atrial fibrillation: Secondary | ICD-10-CM

## 2022-10-12 NOTE — Progress Notes
Implantable Cardiac Monitor  Post Procedure Instructions  Incision Care  Remove outer bandage/dressing 24 hours after procedure, leaving Suture open to air until incision check appointment.  Refrain from showering for 2 days post procedure.  After 2 days, you may shower, however keep incision site as dry as possible.  No submerging in bathtub, pool, hot tub, lake, etc. for 7 days after Implant.  Return to our clinic in 7-10 days for an incision check. The nurse will remove your steri-strips or suture at this time.    Appointment Date, and Time: October 21, 2022 Location: [place], [place] [address] [address]    Activity Restrictions    There are no lifting restrictions. You may drive same day as procedure.  Please limit any type of movement they may stretch the chest area for at least 7 days.    When to Call  Please call our office [phone] if you notice any signs or symptoms of infection including:  Redness, warmth, drainage or swelling at incision site (please note that it is normal to have small amounts of blood-tinged fluid and some swelling immediately following implant. This should resolve within a day)  Fever or chills

## 2022-10-19 ENCOUNTER — Ambulatory Visit: Admit: 2022-10-19 | Discharge: 2022-10-19 | Payer: MEDICARE

## 2022-10-19 ENCOUNTER — Encounter: Admit: 2022-10-19 | Discharge: 2022-10-19 | Payer: MEDICARE

## 2022-10-19 NOTE — Progress Notes
Left chest LINQ incision is clean, dry, well approximated, and healing without evidence of drainage or discharge. Two sutures noted. Suture are clean, dry and intact. Two sutures removed without complication. Pt reports no adverse symptoms. Incision care, including signs and symptoms of infection, reviewed(as below). Pt verbalized understanding and will remain in phone contact.    The incision site may now get wet; however avoid direct contact with the incision (allow the water to hit the back of your shoulder rather than directly on the incision).    Unless your incision is bleeding or draining, keep it open to air.    Your incision should gradually look better each day. Please notify our office immediately if you notice any of the following:   -an increase in swelling or redness   -any drainage   -increasing pain at the incision site  -fever over 100 degrees or chills     Patient requested BP check - BP 140/78. Pt reports at home he is typically 130-140/70s. Wanted to make sure his home BP cuff was within range of manual check. Similar recording today.

## 2022-10-28 ENCOUNTER — Encounter: Admit: 2022-10-28 | Discharge: 2022-10-28 | Payer: MEDICARE

## 2022-10-28 MED ORDER — APIXABAN 5 MG PO TAB
5 mg | ORAL_TABLET | Freq: Two times a day (BID) | ORAL | 1 refills | Status: AC
Start: 2022-10-28 — End: ?

## 2022-10-29 ENCOUNTER — Encounter: Admit: 2022-10-29 | Discharge: 2022-10-29 | Payer: MEDICARE

## 2022-10-29 MED ORDER — APIXABAN 5 MG PO TAB
5 mg | ORAL_TABLET | Freq: Two times a day (BID) | ORAL | 11 refills | Status: AC
Start: 2022-10-29 — End: ?

## 2022-11-15 ENCOUNTER — Encounter: Admit: 2022-11-15 | Discharge: 2022-11-15 | Payer: MEDICARE

## 2022-11-16 ENCOUNTER — Encounter: Admit: 2022-11-16 | Discharge: 2022-11-16 | Payer: MEDICARE

## 2022-11-16 MED ORDER — APIXABAN 5 MG PO TAB
5 mg | ORAL_TABLET | Freq: Two times a day (BID) | ORAL | 3 refills | Status: AC
Start: 2022-11-16 — End: ?

## 2022-11-29 ENCOUNTER — Encounter: Admit: 2022-11-29 | Discharge: 2022-11-29 | Payer: MEDICARE

## 2022-11-29 NOTE — Progress Notes
An application has been submitted to [employer] for Eliquis.      Cole Fischer  Medication Assistance Coordinator   [phone]

## 2023-01-03 ENCOUNTER — Encounter: Admit: 2023-01-03 | Discharge: 2023-01-03 | Payer: MEDICARE

## 2023-01-05 ENCOUNTER — Ambulatory Visit: Admit: 2023-01-05 | Discharge: 2023-01-05 | Payer: MEDICARE

## 2023-01-05 ENCOUNTER — Encounter: Admit: 2023-01-05 | Discharge: 2023-01-05 | Payer: MEDICARE

## 2023-01-05 DIAGNOSIS — Z89611 Acquired absence of right leg above knee: Secondary | ICD-10-CM

## 2023-01-05 DIAGNOSIS — R9430 Abnormal result of cardiovascular function study, unspecified: Secondary | ICD-10-CM

## 2023-01-05 NOTE — Patient Instructions
Please do not hesitate to contact my office with any questions.    Dr. Brent Wise  - Orthopedic Surgeon, Trauma  [facility] - Phone [phone] - Fax [phone]   [address] - [place], [place] [address]      Liz Crane, RN, BSN - Alyssa Cornish, RN  Ambulatory Clinic RN Care Coordinator  - [facility]  Phone [phone] - Fax [phone]   [address] - [place], [place] [address]

## 2023-01-13 ENCOUNTER — Encounter: Admit: 2023-01-13 | Discharge: 2023-01-13 | Payer: MEDICARE

## 2023-01-13 DIAGNOSIS — Z89611 Acquired absence of right leg above knee: Secondary | ICD-10-CM

## 2023-01-13 NOTE — Telephone Encounter
01/13/2023 4:57 PM   Patient LVM requesting call back regarding upcoming appointments. Returned call and patient states he has an orthopedic procedure scheduled for 7/1 at [place] that will require an overnight stay and he will be unable to make his 7/2 appt with [facility]. Let patient know that scheduling team will be reaching out to get his rescheduled. Message sent to scheduling.

## 2023-01-14 ENCOUNTER — Encounter: Admit: 2023-01-14 | Discharge: 2023-01-14 | Payer: MEDICARE

## 2023-01-14 DIAGNOSIS — E785 Hyperlipidemia, unspecified: Secondary | ICD-10-CM

## 2023-01-14 DIAGNOSIS — I10 Essential (primary) hypertension: Secondary | ICD-10-CM

## 2023-01-14 DIAGNOSIS — R9430 Abnormal result of cardiovascular function study, unspecified: Secondary | ICD-10-CM

## 2023-01-14 DIAGNOSIS — I4891 Unspecified atrial fibrillation: Secondary | ICD-10-CM

## 2023-01-14 NOTE — Telephone Encounter
Patient will need to see PAT prior to surgery. I have attempted to call them twice. Left VM requesting he is seen today if possible. Left direct line and nurse line call back numbers.

## 2023-01-17 ENCOUNTER — Encounter: Admit: 2023-01-17 | Discharge: 2023-01-17 | Payer: MEDICARE

## 2023-01-17 MED ORDER — EPHEDRINE SULFATE 50 MG/ML IV SOLN
INTRAVENOUS | 0 refills | Status: DC
Start: 2023-01-17 — End: 2023-01-17

## 2023-01-17 MED ORDER — HYDROMORPHONE (PF) 2 MG/ML IJ SYRG
INTRAVENOUS | 0 refills | Status: DC
Start: 2023-01-17 — End: 2023-01-17

## 2023-01-17 MED ORDER — ONDANSETRON HCL (PF) 4 MG/2 ML IJ SOLN
INTRAVENOUS | 0 refills | Status: DC
Start: 2023-01-17 — End: 2023-01-17

## 2023-01-17 MED ORDER — SUGAMMADEX 100 MG/ML IV SOLN
INTRAVENOUS | 0 refills | Status: DC
Start: 2023-01-17 — End: 2023-01-17

## 2023-01-17 MED ORDER — ROCURONIUM 10 MG/ML IV SOLN
INTRAVENOUS | 0 refills | Status: DC
Start: 2023-01-17 — End: 2023-01-17

## 2023-01-17 MED ORDER — ESMOLOL 100 MG/10 ML (10 MG/ML) IV SOLN
INTRAVENOUS | 0 refills | Status: DC
Start: 2023-01-17 — End: 2023-01-17

## 2023-01-17 MED ORDER — CEFAZOLIN 1 GRAM IJ SOLR
INTRAVENOUS | 0 refills | Status: DC
Start: 2023-01-17 — End: 2023-01-17

## 2023-01-17 MED ORDER — PHENYLEPHRINE HCL IN 0.9% NACL 1 MG/10 ML (100 MCG/ML) IV SYRG
INTRAVENOUS | 0 refills | Status: DC
Start: 2023-01-17 — End: 2023-01-17

## 2023-01-17 MED ORDER — FENTANYL CITRATE (PF) 50 MCG/ML IJ SOLN
INTRAVENOUS | 0 refills | Status: DC
Start: 2023-01-17 — End: 2023-01-17

## 2023-01-17 MED ORDER — DEXAMETHASONE SODIUM PHOSPHATE 4 MG/ML IJ SOLN
INTRAVENOUS | 0 refills | Status: DC
Start: 2023-01-17 — End: 2023-01-17

## 2023-01-17 MED ORDER — PROPOFOL INJ 10 MG/ML IV VIAL
INTRAVENOUS | 0 refills | Status: DC
Start: 2023-01-17 — End: 2023-01-17

## 2023-01-17 MED ORDER — ARTIFICIAL TEARS (PF) SINGLE DOSE DROPS GROUP
OPHTHALMIC | 0 refills | Status: DC
Start: 2023-01-17 — End: 2023-01-17

## 2023-01-17 MED ORDER — LIDOCAINE (PF) 200 MG/10 ML (2 %) IJ SYRG
INTRAVENOUS | 0 refills | Status: DC
Start: 2023-01-17 — End: 2023-01-17

## 2023-01-17 MED ADMIN — OXYCODONE 10 MG PO TAB [166908]: 10 mg | ORAL | @ 23:00:00 | NDC 68084096811

## 2023-01-17 MED ADMIN — MAGNESIUM HYDROXIDE 400 MG/5 ML PO SUSP [79944]: 30 mL | ORAL | @ 19:00:00 | NDC 00121043130

## 2023-01-17 MED ADMIN — ACETAMINOPHEN 325 MG PO TAB [101]: 650 mg | ORAL | @ 16:00:00 | NDC 00904677361

## 2023-01-17 MED ADMIN — CARVEDILOL 6.25 MG PO TAB [77309]: 6.25 mg | ORAL | @ 19:00:00 | NDC 00904630161

## 2023-01-17 MED ADMIN — GABAPENTIN 100 MG PO CAP [18309]: 800 mg | ORAL | @ 18:00:00 | NDC 60687058011

## 2023-01-17 MED ADMIN — CYCLOBENZAPRINE 10 MG PO TAB [2017]: 10 mg | ORAL | @ 16:00:00 | NDC 72888001405

## 2023-01-17 MED ADMIN — HYDROMORPHONE (PF) 2 MG/ML IJ SYRG [163476]: 0.5 mg | INTRAVENOUS | @ 15:00:00 | Stop: 2023-01-17 | NDC 00409131203

## 2023-01-17 MED ADMIN — ACETAMINOPHEN 325 MG PO TAB [101]: 650 mg | ORAL | @ 23:00:00 | NDC 00904677361

## 2023-01-17 MED ADMIN — GABAPENTIN 300 MG PO CAP [18308]: 800 mg | ORAL | @ 18:00:00 | NDC 67877022305

## 2023-01-17 MED ADMIN — ACETAMINOPHEN 500 MG PO TAB [102]: 1000 mg | ORAL | @ 12:00:00 | Stop: 2023-01-17 | NDC 00904673061

## 2023-01-17 MED ADMIN — CEFAZOLIN 2 GRAM IV SOLR [462609]: 2 g | INTRAVENOUS | @ 21:00:00 | Stop: 2023-01-18 | NDC 00143913901

## 2023-01-17 MED ADMIN — ATORVASTATIN 40 MG PO TAB [77113]: 40 mg | ORAL | @ 19:00:00 | NDC 00904629261

## 2023-01-17 MED ADMIN — SODIUM CHLORIDE 0.9 % IV SOLP [27838]: 1000.000 mL | INTRAVENOUS | @ 14:00:00 | Stop: 2023-01-19 | NDC 00338004904

## 2023-01-17 MED ADMIN — OXYCODONE 5 MG PO TAB [10814]: 5 mg | ORAL | @ 15:00:00 | Stop: 2023-01-17 | NDC 42858000110

## 2023-01-17 MED ADMIN — DULOXETINE 30 MG PO CPDR [93424]: 30 mg | ORAL | @ 19:00:00 | NDC 00904704461

## 2023-01-17 MED ADMIN — OXYCODONE 5 MG PO TAB [10814]: 10 mg | ORAL | @ 18:00:00 | NDC 42858000110

## 2023-01-17 MED ADMIN — PANTOPRAZOLE 40 MG PO TBEC [80436]: 40 mg | ORAL | @ 19:00:00 | NDC 00904647461

## 2023-01-17 MED ADMIN — HYDROMORPHONE (PF) 2 MG/ML IJ SYRG [163476]: 0.5 mg | INTRAVENOUS | @ 14:00:00 | Stop: 2023-01-17 | NDC 00409131203

## 2023-01-17 MED ADMIN — SODIUM CHLORIDE 0.9 % IV SOLP [27838]: 1000 mL | INTRAVENOUS | @ 12:00:00 | Stop: 2023-01-17 | NDC 00338004904

## 2023-01-17 MED ADMIN — DOCUSATE SODIUM 100 MG PO CAP [2566]: 100 mg | ORAL | @ 21:00:00 | NDC 00904718361

## 2023-01-18 ENCOUNTER — Encounter: Admit: 2023-01-18 | Discharge: 2023-01-18 | Payer: MEDICARE

## 2023-01-18 MED ADMIN — CARVEDILOL 6.25 MG PO TAB [77309]: 6.25 mg | ORAL | @ 01:00:00 | NDC 00904630161

## 2023-01-18 MED ADMIN — WATER FOR INJECTION, STERILE IJ SOLN [79513]: 20 mL | INTRAVENOUS | @ 05:00:00 | Stop: 2023-01-18 | NDC 00409488723

## 2023-01-18 MED ADMIN — OXYCODONE 15 MG PO TAB [28899]: 15 mg | ORAL | @ 18:00:00 | NDC 68094000559

## 2023-01-18 MED ADMIN — DULOXETINE 30 MG PO CPDR [93424]: 30 mg | ORAL | @ 14:00:00 | NDC 00904704461

## 2023-01-18 MED ADMIN — PANTOPRAZOLE 40 MG PO TBEC [80436]: 40 mg | ORAL | @ 14:00:00 | NDC 00904647461

## 2023-01-18 MED ADMIN — OXYCODONE 10 MG PO TAB [166908]: 10 mg | ORAL | @ 05:00:00 | NDC 68084096811

## 2023-01-18 MED ADMIN — CEFAZOLIN 2 GRAM IV SOLR [462609]: 2 g | INTRAVENOUS | @ 05:00:00 | Stop: 2023-01-18 | NDC 00143913901

## 2023-01-18 MED ADMIN — PANTOPRAZOLE 40 MG PO TBEC [80436]: 40 mg | ORAL | @ 01:00:00 | NDC 00904647461

## 2023-01-18 MED ADMIN — OXYCODONE 15 MG PO TAB [28899]: 15 mg | ORAL | @ 09:00:00 | NDC 68094000559

## 2023-01-18 MED ADMIN — ATORVASTATIN 40 MG PO TAB [77113]: 40 mg | ORAL | @ 14:00:00 | NDC 00904629261

## 2023-01-18 MED ADMIN — GABAPENTIN 400 MG PO CAP [18307]: 800 mg | ORAL | @ 14:00:00 | NDC 00904666761

## 2023-01-18 MED ADMIN — DOCUSATE SODIUM 100 MG PO CAP [2566]: 100 mg | ORAL | @ 22:00:00 | NDC 00904718361

## 2023-01-18 MED ADMIN — OXYCODONE 15 MG PO TAB [28899]: 15 mg | ORAL | @ 14:00:00 | NDC 68094000559

## 2023-01-18 MED ADMIN — ENOXAPARIN 40 MG/0.4 ML SC SYRG [85052]: 40 mg | SUBCUTANEOUS | @ 01:00:00 | NDC 00781324602

## 2023-01-18 MED ADMIN — OXYCODONE 15 MG PO TAB [28899]: 15 mg | ORAL | @ 22:00:00 | NDC 68094000559

## 2023-01-18 MED ADMIN — CARVEDILOL 6.25 MG PO TAB [77309]: 6.25 mg | ORAL | @ 14:00:00 | NDC 00904630161

## 2023-01-19 ENCOUNTER — Encounter: Admit: 2023-01-19 | Discharge: 2023-01-19 | Payer: MEDICARE

## 2023-01-19 DIAGNOSIS — I4891 Unspecified atrial fibrillation: Secondary | ICD-10-CM

## 2023-01-19 DIAGNOSIS — E785 Hyperlipidemia, unspecified: Secondary | ICD-10-CM

## 2023-01-19 DIAGNOSIS — R9430 Abnormal result of cardiovascular function study, unspecified: Secondary | ICD-10-CM

## 2023-01-19 DIAGNOSIS — I10 Essential (primary) hypertension: Secondary | ICD-10-CM

## 2023-01-19 MED ADMIN — CARVEDILOL 6.25 MG PO TAB [77309]: 6.25 mg | ORAL | @ 02:00:00 | NDC 00904630161

## 2023-01-19 MED ADMIN — OXYCODONE 15 MG PO TAB [28899]: 15 mg | ORAL | @ 03:00:00 | NDC 68094000559

## 2023-01-19 MED ADMIN — PANTOPRAZOLE 40 MG PO TBEC [80436]: 40 mg | ORAL | @ 13:00:00 | Stop: 2023-01-19 | NDC 00904647461

## 2023-01-19 MED ADMIN — OXYCODONE 15 MG PO TAB [28899]: 15 mg | ORAL | @ 07:00:00 | Stop: 2023-01-19 | NDC 68094000559

## 2023-01-19 MED ADMIN — GABAPENTIN 400 MG PO CAP [18307]: 800 mg | ORAL | @ 13:00:00 | Stop: 2023-01-19 | NDC 63739090410

## 2023-01-19 MED ADMIN — ATORVASTATIN 40 MG PO TAB [77113]: 40 mg | ORAL | @ 13:00:00 | Stop: 2023-01-19 | NDC 00904629261

## 2023-01-19 MED ADMIN — MAGNESIUM HYDROXIDE 400 MG/5 ML PO SUSP [79944]: 30 mL | ORAL | @ 13:00:00 | Stop: 2023-01-19 | NDC 00121043130

## 2023-01-19 MED ADMIN — OXYCODONE 15 MG PO TAB [28899]: 15 mg | ORAL | @ 16:00:00 | Stop: 2023-01-19 | NDC 68094000559

## 2023-01-19 MED ADMIN — DOCUSATE SODIUM 100 MG PO CAP [2566]: 100 mg | ORAL | @ 13:00:00 | Stop: 2023-01-19 | NDC 00904718361

## 2023-01-19 MED ADMIN — OXYCODONE 15 MG PO TAB [28899]: 15 mg | ORAL | @ 12:00:00 | Stop: 2023-01-19 | NDC 68094000559

## 2023-01-19 MED ADMIN — DULOXETINE 30 MG PO CPDR [93424]: 30 mg | ORAL | @ 13:00:00 | Stop: 2023-01-19 | NDC 00904704461

## 2023-01-19 MED ADMIN — PANTOPRAZOLE 40 MG PO TBEC [80436]: 40 mg | ORAL | @ 02:00:00 | NDC 00904647461

## 2023-01-19 MED ADMIN — ENOXAPARIN 40 MG/0.4 ML SC SYRG [85052]: 40 mg | SUBCUTANEOUS | @ 02:00:00 | NDC 00781324602

## 2023-01-19 MED ADMIN — CARVEDILOL 6.25 MG PO TAB [77309]: 6.25 mg | ORAL | @ 13:00:00 | Stop: 2023-01-19 | NDC 00904630161

## 2023-01-19 MED FILL — OXYCODONE 5 MG PO TAB: 5 mg | ORAL | 3 days supply | Qty: 30 | Fill #1 | Status: CP

## 2023-01-19 MED FILL — SULFAMETHOXAZOLE-TRIMETHOPRIM 800-160 MG PO TAB: 160/800 mg | ORAL | 14 days supply | Qty: 28 | Fill #1 | Status: CP

## 2023-01-26 ENCOUNTER — Encounter: Admit: 2023-01-26 | Discharge: 2023-01-26 | Payer: MEDICARE

## 2023-01-26 MED ORDER — OXYCODONE 5 MG PO TAB
5-10 mg | ORAL_TABLET | ORAL | 0 refills | 6.00000 days | Status: AC | PRN
Start: 2023-01-26 — End: ?

## 2023-01-31 ENCOUNTER — Encounter: Admit: 2023-01-31 | Discharge: 2023-01-31 | Payer: MEDICARE

## 2023-02-01 ENCOUNTER — Ambulatory Visit: Admit: 2023-02-01 | Discharge: 2023-02-02 | Payer: MEDICARE

## 2023-02-01 ENCOUNTER — Encounter: Admit: 2023-02-01 | Discharge: 2023-02-01 | Payer: MEDICARE

## 2023-02-01 DIAGNOSIS — I10 Essential (primary) hypertension: Secondary | ICD-10-CM

## 2023-02-01 DIAGNOSIS — I480 Paroxysmal atrial fibrillation: Secondary | ICD-10-CM

## 2023-02-01 DIAGNOSIS — E785 Hyperlipidemia, unspecified: Secondary | ICD-10-CM

## 2023-02-01 DIAGNOSIS — I4891 Unspecified atrial fibrillation: Secondary | ICD-10-CM

## 2023-02-01 DIAGNOSIS — R9430 Abnormal result of cardiovascular function study, unspecified: Secondary | ICD-10-CM

## 2023-02-01 NOTE — Progress Notes
Date of Service: 02/01/2023    Cole Fischer is a 67 y.o. male.       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  Recently he was hospitalized at [facility] hospital from January 17, 2023 until January 19, 2023 for revision of his right leg wound.  The right leg bandage has not been removed since discharge and he reports that he is scheduled for follow-up in orthopedic clinic tomorrow.  Telemetry from his hospitalization showed normal sinus rhythm with a heart rate of 80 bpm.  He has switched his anticoagulation from apixaban to rivaroxaban for affordability.  He reports that he has been compliant with rivaroxaban.  His CHA2DS2-VASc score would be at least 4 for diabetes, hypertension, age and peripheral vascular disease.  Otherwise, the patient has been doing well and reports no angina, congestive symptoms, palpitations, sensation of sustained forceful heart pounding, lightheadedness or syncope.  His exercise tolerance has been stable.  He can now walk short distances using his crutches without difficulty.  The patient reports no myalgias, bleeding abnormalities, or strokelike symptoms.       Vitals:    02/01/23 1524   BP: 119/76   BP Source: Arm, Left Upper   Pulse: 97   SpO2: 97%   O2 Device: None (Room air)   PainSc: Zero   Weight: 109.4 kg (241 lb 3.2 oz)   Height: 193 cm (6' 4)     Body mass index is 29.36 kg/m?Marland Kitchen     Past Medical History  Patient Active Problem List    Diagnosis Date Noted    Hx of AKA (above knee amputation), right (HCC) 01/17/2023    Wound of right leg, sequela 01/17/2023    Abnormal cardiac function test 03/15/2022    Essential hypertension 08/07/2020    Hyperlipidemia 08/07/2020    Type 2 diabetes mellitus (HCC) 08/07/2020    Lower extremity edema 08/07/2020    Left bundle branch block 08/07/2020     -08/23/19- MPI- Normal global LV function. EF 61%. Medium defect of mild to moderate intensity during stress. Fixed perfusion abnormality in the mid anterior, apical anterior, and septal and apex segments consistent with attenuation and artifact. Negative stress test for evidence of pharmacologically induced ischemia.   -08/23/19 Echo- EF 55%      PVD (peripheral vascular disease) (HCC) 08/07/2020         Review of Systems   Constitutional: Negative.   HENT: Negative.     Eyes: Negative.    Cardiovascular: Negative.    Respiratory: Negative.     Endocrine: Negative.    Hematologic/Lymphatic: Negative.    Skin: Negative.    Musculoskeletal: Negative.    Gastrointestinal: Negative.    Genitourinary: Negative.    Neurological: Negative.    Psychiatric/Behavioral: Negative.     Allergic/Immunologic: Negative.        Physical Exam  GENERAL: The patient is well developed, well nourished, resting comfortably and in no distress.   HEENT: No abnormalities of the visible oro-nasopharynx, conjunctiva or sclera are noted.  NECK: There is  no jugular venous distension. Carotids are palpable and without bruits. There is no thyroid enlargement.  Chest: Lung fields are clear to auscultation. There are no wheezes or crackles.  CV: There is a regular rhythm. The first and second heart sounds are normal. There are no murmurs, gallops or rubs.  I listened to him for a full 30 seconds and his heart rhythm was regular with a heart rate of 84 bpm.  ABD: The abdomen is soft and supple with normal bowel sounds. There is no hepatosplenomegaly, ascites, tenderness, masses or bruits.  Neuro: There are no focal motor defects.  Limited ambulation is okay using crutches.  Cognitive function appears normal.  Ext: His right above-the-knee amputation wound is covered with a bandage.  There is no edema or sores on his left foot.  SKIN: There are no rashes and no cellulitis  PSYCH: The patient is calm, rationale and oriented.    Cardiovascular Studies  A twelve-lead ECG dated 2022/08/03 reveals normal sinus rhythm with a heart rate of 66 bpm.  Left bundle branch block is noted.  Telemetry from his recent hospitalization on 01/17/2023 shows normal sinus rhythm with a heart rate of 80 bpm.  Transesophageal echo 05/14/2022:  No clot is visualized in left atrial appendage.  Decrease left atrial appendageal velocities ~30 cm/s.  Left ventricular systolic function is normal 60%.  No regional wall motion abnormalities seen.  Normal left ventricular size.  Right ventricle size and function is normal.  Normal biatrial size.  Lipomatous hypertrophy of interatrial septum.  No evidence of interatrial shunt on color Doppler.  Mild mitral regurgitation is present.  No other significant valvular regurgitation or stenosis is present.  No pericardial effusion.  Normal caliber thoracic aorta without significant atheromatous plaquing.  Cardiovascular Health Factors  Vitals BP Readings from Last 3 Encounters:   02/01/23 119/76   01/19/23 (!) 146/76   08/03/2022 124/72     Wt Readings from Last 3 Encounters:   02/01/23 109.4 kg (241 lb 3.2 oz)   01/19/23 109.3 kg (241 lb)   08/13/22 104.8 kg (231 lb)     BMI Readings from Last 3 Encounters:   02/01/23 29.36 kg/m?   01/19/23 29.34 kg/m?   08/13/22 28.12 kg/m?      Smoking Social History     Tobacco Use   Smoking Status Former    Current packs/day: 0.00    Types: Cigarettes    Quit date: 2017    Years since quitting: 7.5   Smokeless Tobacco Never      Lipid Profile Cholesterol   Date Value Ref Range Status   12/30/2021 126  Final     HDL   Date Value Ref Range Status   12/30/2021 29 (L) >=40 Final     LDL   Date Value Ref Range Status   12/30/2021 56  Final     Triglycerides   Date Value Ref Range Status   12/30/2021 207 (H) <150 Final      Blood Sugar Hemoglobin A1C   Date Value Ref Range Status   05/13/2020 13.0 (H) <5.7 Final     Glucose   Date Value Ref Range Status   01/19/2023 129 (H) 70 - 100 MG/DL Final   45/40/9811 914 (H) 70 - 100 MG/DL Final   78/29/5621 308 (H) 70 - 105 Final     Glucose, POC   Date Value Ref Range Status   01/18/2023 127 (H) 70 - 100 MG/DL Final  01/18/2023 142 (H) 70 - 100 MG/DL Final   45/40/9811 914 (H) 70 - 100 MG/DL Final          Problems Addressed Today  No diagnosis found.    Assessment and Plan     Mr. Schmitzer reports that he has been stable since discharge on 01/19/2023.  He reports no angina or congestive symptoms.  The patient reports no palpitations suggestive for recurrent atrial fibrillation. The risks and benefits of anticoagulation therapy have been reviewed with the patient. The patient understands that anticoagulation is used to decrease thrombotic or clotting complications associated with atrial fibrillation/flutter, such as stroke and systemic embolization which can be disabling or fatal, but can, on occasion, lead to life-threatening bleeding complications including gastrointestinal or intracranial hemorrhage. The patient wishes to continue anticoagulation with rivaroxaban.  His appropriate dose appears to be 20 mg daily.  Hopefully if his right leg wound heals he can be reconsidered for placement of a left atrial appendage occlusion device.  Cardiovascular risk factor modification was discussed in great detail.  I have asked him to return for follow-up in approximately 6 months. The total time spent during this interview and exam with preparation and chart review was 30 minutes.         Current Medications (including today's revisions)   acetaminophen (TYLENOL) 325 mg tablet Take two tablets by mouth every 4 hours as needed. Indications: pain    aspirin EC (ASPIR-LOW) 81 mg tablet Take one tablet by mouth every 12 hours for 30 days. Indications: dvt ppx    atorvastatin (LIPITOR) 40 mg tablet Take one tablet by mouth daily. (Patient taking differently: Take one tablet by mouth at bedtime daily.)    carvediloL (COREG) 6.25 mg tablet TAKE 1 TABLET BY MOUTH TWICE DAILY WITH MEALS    chlorthalidone (HYGROTON) 25 mg tablet Take 1/2 (one-half) tablet by mouth once daily    cilostazoL (PLETAL) 50 mg tablet Take one tablet by mouth twice daily.    clopiDOGreL (PLAVIX) 75 mg tablet Take one tablet by mouth daily. Reports Watchman device    cyclobenzaprine (FLEXERIL) 10 mg tablet Take one tablet by mouth three times daily as needed.    docusate (COLACE) 100 mg capsule Take one capsule by mouth twice daily as needed for Constipation. Indications: constipation    duloxetine DR (CYMBALTA) 30 mg capsule Take one capsule by mouth daily.    gabapentin (NEURONTIN) 800 mg tablet Take one tablet by mouth three times daily. Patient takes 800mg  AM, 400mg  at noon, and 800mg  PM    metFORMIN (GLUCOPHAGE) 500 mg tablet Take one tablet by mouth daily.    oxyCODONE (ROXICODONE) 5 mg tablet Take one tablet to two tablets by mouth every 4 hours as needed. Indications: pain    pantoprazole DR (PROTONIX) 40 mg tablet Take one tablet by mouth twice daily.    rivaroxaban (XARELTO) 20 mg tablet Take one tablet by mouth daily.    spironolactone (ALDACTONE) 25 mg tablet Take one-half tablet by mouth daily.    triamcinolone acetonide (KENALOG) 0.1 % topical cream Apply one g topically to affected area as Needed.    trimethoprim/sulfamethoxazole (BACTRIM DS) 160/800 mg tablet Take one tablet by mouth twice daily for 14 days. Indications: skin and skin structure infection    valsartan (DIOVAN) 320 mg tablet Take one tablet by mouth daily.

## 2023-02-01 NOTE — Patient Instructions
Thank you for visiting our office today.    We would like to make the following medication adjustments:  NONE       Otherwise continue the same medications as you have been doing.          We will be pursuing the following tests after your appointment today:            We will plan to see you back in 6 months.  Please call us in the meantime with any questions or concerns.        Please allow 5-7 business days for our providers to review your results. All normal results will go to MyChart. If you do not have Mychart, it is strongly recommended to get this so you can easily view all your results. If you do not have mychart, we will attempt to call you once with normal lab and testing results. If we cannot reach you by phone with normal results, we will send you a letter.  If you have not heard the results of your testing after one week please give us a call.       Your Cardiovascular Medicine [place] Team (Steve, Lisa, Jamie, Melanie, and Javari Bufkin)  phone number is [phone].

## 2023-02-02 ENCOUNTER — Ambulatory Visit: Admit: 2023-02-02 | Discharge: 2023-02-03 | Payer: 59

## 2023-02-02 ENCOUNTER — Encounter: Admit: 2023-02-02 | Discharge: 2023-02-02 | Payer: MEDICARE

## 2023-02-02 NOTE — Patient Instructions
Please do not hesitate to contact my office with any questions.    Dr. Brent Wise  - Orthopedic Surgeon, Trauma  [facility] - Phone [phone] - Fax [phone]   [address] - [place], [place] [address]      Liz Crane, RN, BSN - Saraiah Bhat, RN  Ambulatory Clinic RN Care Coordinator  - [facility]  Phone [phone] - Fax [phone]   [address] - [place],  [address]

## 2023-02-09 ENCOUNTER — Ambulatory Visit: Admit: 2023-02-09 | Discharge: 2023-02-10 | Payer: 59

## 2023-02-09 ENCOUNTER — Encounter: Admit: 2023-02-09 | Discharge: 2023-02-09 | Payer: MEDICARE

## 2023-02-09 NOTE — Telephone Encounter
HEDLEY STUDENT states he still has two staples that were not removed at previous appointment.  He is scheduled for nurse visit today at 11 to remove remainder of the staples.   All questions are answered and he verbalized understanding. he has no other concerns at this time.

## 2023-02-14 ENCOUNTER — Encounter: Admit: 2023-02-14 | Discharge: 2023-02-14 | Payer: MEDICARE

## 2023-03-09 ENCOUNTER — Encounter: Admit: 2023-03-09 | Discharge: 2023-03-09 | Payer: MEDICARE

## 2023-03-09 ENCOUNTER — Ambulatory Visit: Admit: 2023-03-09 | Discharge: 2023-03-10 | Payer: 59

## 2023-03-09 MED ORDER — LIDOCAINE 5 % TP PTMD
1 | MEDICATED_PATCH | Freq: Every day | TOPICAL | 3 refills | 30.00000 days | Status: AC
Start: 2023-03-09 — End: ?

## 2023-03-18 ENCOUNTER — Encounter: Admit: 2023-03-18 | Discharge: 2023-03-18 | Payer: MEDICARE

## 2023-03-28 ENCOUNTER — Encounter: Admit: 2023-03-28 | Discharge: 2023-03-28 | Payer: MEDICARE

## 2023-03-28 NOTE — Telephone Encounter
Patient c/o 2 dark red spots that are painful and warm on his right leg that was operated on (AKA, above where incision is). He is having a hard time working his shifts at [employer] with his prosthesis.The painful areas are at the bottom of his stump. Patient has tried a thicker sock over stump and found no benefit from doing so. Spots have been present for 5 days now without improvement, they are not spreading and have remained the same size since he noticed them.

## 2023-03-29 ENCOUNTER — Encounter: Admit: 2023-03-29 | Discharge: 2023-03-29 | Payer: MEDICARE

## 2023-03-29 NOTE — Telephone Encounter
I have returned call to [facility].  He is scheduled for appt tomorrow for evaluation of spots on operative leg.   All questions are answered and he verbalized understanding. he has no other concerns at this time.

## 2023-03-30 ENCOUNTER — Encounter: Admit: 2023-03-30 | Discharge: 2023-03-30 | Payer: MEDICARE

## 2023-03-30 ENCOUNTER — Ambulatory Visit: Admit: 2023-03-30 | Discharge: 2023-03-31 | Payer: 59

## 2023-03-31 ENCOUNTER — Encounter: Admit: 2023-03-31 | Discharge: 2023-03-31 | Payer: MEDICARE

## 2023-03-31 DIAGNOSIS — T879 Unspecified complications of amputation stump: Secondary | ICD-10-CM

## 2023-03-31 MED ORDER — CLINDAMYCIN HCL 300 MG PO CAP
600 mg | ORAL_CAPSULE | ORAL | 0 refills | Status: AC
Start: 2023-03-31 — End: ?

## 2023-03-31 MED ORDER — MUPIROCIN CALCIUM 2 % TP CREA
Freq: Three times a day (TID) | TOPICAL | 0 refills | 11.00000 days | Status: AC
Start: 2023-03-31 — End: ?

## 2023-03-31 MED ORDER — MUPIROCIN 2 % TP OINT
0 refills
Start: 2023-03-31 — End: ?

## 2023-03-31 NOTE — Telephone Encounter
Received vm from patient regarding prescriptions from yesterday's visit. Assured patient I would follow up with him as soon as I know more about them.

## 2023-03-31 NOTE — Telephone Encounter
Patient seen in clinic on 9/11 and lvm regarding prescriptions to be called in after visit. No scripts on file for yesterdays visit. Will route to provider for further advisement.

## 2023-03-31 NOTE — Telephone Encounter
Spoke with patient and informed him that his prescriptions have been called in to his pharmacy.

## 2023-04-01 ENCOUNTER — Encounter: Admit: 2023-04-01 | Discharge: 2023-04-01 | Payer: MEDICARE

## 2023-04-01 DIAGNOSIS — T879 Unspecified complications of amputation stump: Secondary | ICD-10-CM

## 2023-04-01 MED ORDER — MUPIROCIN 2 % TP OINT
Freq: Three times a day (TID) | TOPICAL | 0 refills | 11.00000 days | Status: AC
Start: 2023-04-01 — End: ?

## 2023-04-04 ENCOUNTER — Encounter: Admit: 2023-04-04 | Discharge: 2023-04-04 | Payer: MEDICARE

## 2023-04-04 MED ORDER — ATORVASTATIN 40 MG PO TAB
40 mg | ORAL_TABLET | Freq: Every day | ORAL | 0 refills
Start: 2023-04-04 — End: ?

## 2023-04-05 ENCOUNTER — Encounter: Admit: 2023-04-05 | Discharge: 2023-04-05 | Payer: MEDICARE

## 2023-04-05 DIAGNOSIS — T879 Unspecified complications of amputation stump: Secondary | ICD-10-CM

## 2023-04-05 MED ORDER — MUPIROCIN 2 % TP OINT
Freq: Three times a day (TID) | TOPICAL | 0 refills | 11.00000 days | Status: AC
Start: 2023-04-05 — End: ?

## 2023-04-18 ENCOUNTER — Encounter: Admit: 2023-04-18 | Discharge: 2023-04-18 | Payer: MEDICARE

## 2023-05-11 ENCOUNTER — Encounter: Admit: 2023-05-11 | Discharge: 2023-05-11 | Payer: MEDICARE

## 2023-05-11 ENCOUNTER — Ambulatory Visit: Admit: 2023-05-11 | Discharge: 2023-05-12 | Payer: MEDICARE

## 2023-05-11 NOTE — Patient Instructions
Please do not hesitate to contact my office with any questions.    Dr. Blinda Leatherwood  - Orthopedic Surgeon, Trauma  [facility] - Phone [phone] - Fax [phone]   [address] [place], [place] [address]      Maryellen Pile, RN, BSN -   Ambulatory Clinic RN Care Coordinator  - [facility]  Phone [phone] - Fax [phone]   [address] [place], [place] [address]

## 2023-05-18 ENCOUNTER — Ambulatory Visit: Admit: 2023-05-18 | Discharge: 2023-05-19 | Payer: MEDICARE

## 2023-06-18 ENCOUNTER — Ambulatory Visit: Admit: 2023-06-18 | Discharge: 2023-06-19 | Payer: MEDICARE

## 2023-06-20 ENCOUNTER — Encounter: Admit: 2023-06-20 | Discharge: 2023-06-20 | Payer: MEDICARE

## 2023-07-07 ENCOUNTER — Encounter: Admit: 2023-07-07 | Discharge: 2023-07-07 | Payer: MEDICARE

## 2023-07-07 MED ORDER — CARVEDILOL 6.25 MG PO TAB
ORAL_TABLET | ORAL | 1 refills | 90.00000 days | Status: AC
Start: 2023-07-07 — End: ?

## 2023-07-19 ENCOUNTER — Ambulatory Visit: Admit: 2023-07-19 | Discharge: 2023-07-20 | Payer: MEDICARE

## 2023-08-04 ENCOUNTER — Encounter: Admit: 2023-08-04 | Discharge: 2023-08-04 | Payer: MEDICARE

## 2023-08-09 ENCOUNTER — Encounter: Admit: 2023-08-09 | Discharge: 2023-08-09 | Payer: MEDICARE

## 2023-08-09 ENCOUNTER — Ambulatory Visit: Admit: 2023-08-09 | Discharge: 2023-08-09 | Payer: MEDICARE

## 2023-08-17 ENCOUNTER — Encounter: Admit: 2023-08-17 | Discharge: 2023-08-17 | Payer: MEDICARE

## 2023-08-17 NOTE — Telephone Encounter
Received notification that  [employer] Carelink has not been connected since 08/01/23. Patient was instructed to look at his/her transmitter to make sure that it is plugged into power and send a manual transmission to reconnect the transmitter. If he/she has any questions about how to send a transmission or if the transmitter does not appear to be working properly, they need to contact the device company directly. Patient was provided with that contact number. Requested the patient send [place] a MyChart message or contact our device nurses at [phone] to let us know after they have sent their transmission. LVM for pt to reconnect. BC      Note: Patient needs to send a manual remote interrogation to reestablish communication to his/her remote transmitter.

## 2023-08-19 ENCOUNTER — Ambulatory Visit: Admit: 2023-08-19 | Discharge: 2023-08-20 | Payer: MEDICARE

## 2023-09-15 ENCOUNTER — Encounter: Admit: 2023-09-15 | Discharge: 2023-09-15 | Payer: MEDICARE

## 2023-09-15 DIAGNOSIS — I447 Left bundle-branch block, unspecified: Secondary | ICD-10-CM

## 2023-09-19 ENCOUNTER — Ambulatory Visit: Admit: 2023-09-19 | Discharge: 2023-09-20 | Payer: MEDICARE

## 2023-09-29 ENCOUNTER — Encounter: Admit: 2023-09-29 | Discharge: 2023-09-29 | Payer: MEDICARE

## 2023-09-29 NOTE — Progress Notes
 Patient he has had issues with diarrhea due to metformin.  He saw Dr. Andreas Newport how reduced the dose.  He has had some dizziness especially when changing position from sitting to standing.  At last office visit spirolactone was discontinues.  Patient was instructed to restart if bp started staying above 130.  He had recently resumed and it was discontinued again 2 weeks ago by Dr Andreas Newport.  When Dizziness occurs pt was instructed to check bp and blood sugar.    Patient's at OV with [facility] was 120/66 P102.  Looks like he is currently taking metoprolol 25mg  bid, and valsartan 320mg  daily for blood pressure.

## 2023-10-20 ENCOUNTER — Encounter: Admit: 2023-10-20 | Discharge: 2023-10-20

## 2023-11-02 ENCOUNTER — Encounter: Admit: 2023-11-02 | Discharge: 2023-11-02 | Payer: MEDICARE

## 2023-11-14 ENCOUNTER — Encounter: Admit: 2023-11-14 | Discharge: 2023-11-14 | Payer: MEDICARE

## 2023-11-16 ENCOUNTER — Encounter: Admit: 2023-11-16 | Discharge: 2023-11-16 | Payer: MEDICARE

## 2023-11-16 NOTE — Telephone Encounter
 Left voicemail with pt regarding the Regadenoson Stress PET/CT test scheduled for Tuesday May 6th  @ 9:30 am. Pt given instructions as listed below. Pt does not have a my chart, so unable to send instructions there.    You are scheduled for a Regadenoson Stress PET/CT test on Tuesday May 6th at 9:30 am.    Please check into the cardiology clinic on the ground floor of [facility] at 8:30 am. This is a timed test and it is important you arrive on time in order to complete your test. If not, you may need to be rescheduled.    Please have nothing to eat or drink after midnight.  It is ok to take your prescribed medications with a sip of water.  Please hold the following medications: vitamins, supplements, Glucophage and Aldactone or any other diabetic medications or diuretics.    Please DO NOT consume any caffeine products, including coffee, decaf coffee, decaf tea, regular tea, soda, diet sodas, chocolate, or over-the-counter medications containing caffeine (ie. Excedrin) for 24 hours prior to your test. If consumed, we will be unable to perform your test.     Please wear comfortable clothing and avoid applying lotions and creams to your chest.  We will be starting an IV and applying multiple EKG stickers to your chest.    You will need to be able to raise both arms up by your head and lie on your back for about 30 minutes.  Please discuss this with your doctor or talk with the nuclear technologist or nurse if you have concerns. You do not need a driver for this test. However if you think you may need medication to ease anxiety while in the scanner to complete this test, that is a possibility but then you MUST bring a driver with you to the test to receive the medication.     If you have any questions regarding this test, please call [phone] or [phone].

## 2023-11-21 ENCOUNTER — Encounter: Admit: 2023-11-21 | Discharge: 2023-11-21 | Payer: MEDICARE

## 2023-11-22 ENCOUNTER — Ambulatory Visit: Admit: 2023-11-22 | Discharge: 2023-11-22 | Payer: MEDICARE

## 2023-11-22 ENCOUNTER — Encounter: Admit: 2023-11-22 | Discharge: 2023-11-22 | Payer: MEDICARE

## 2023-11-29 ENCOUNTER — Encounter: Admit: 2023-11-29 | Discharge: 2023-11-29 | Payer: MEDICARE

## 2023-11-29 NOTE — Telephone Encounter
 Pt called requesting results of recent PET stress test and wanting to know if he is able to proceed with urology surgery. On 11/23/23 Per Prentice Brochure, RN addended note on 4/16 :   PET stress test results discussed with Dr. Dania Dupre in clinic. Test results favorable and patient may proceed with urology procedure without further testing or office visit.     I called the patient back and left a message of results and recommendations. Gave pt phone number to call back for any further questions. Melanie, RN states she sent notes to Urology several times but was unable to contact the office.

## 2023-12-21 ENCOUNTER — Encounter: Admit: 2023-12-21 | Discharge: 2023-12-21 | Payer: MEDICARE

## 2023-12-28 ENCOUNTER — Encounter: Admit: 2023-12-28 | Discharge: 2023-12-28 | Payer: MEDICARE

## 2024-01-05 IMAGING — CR CXRPOST
1 series · 1 of 1 positions shown · non-contrast
Comparison: none

[t chest ap]
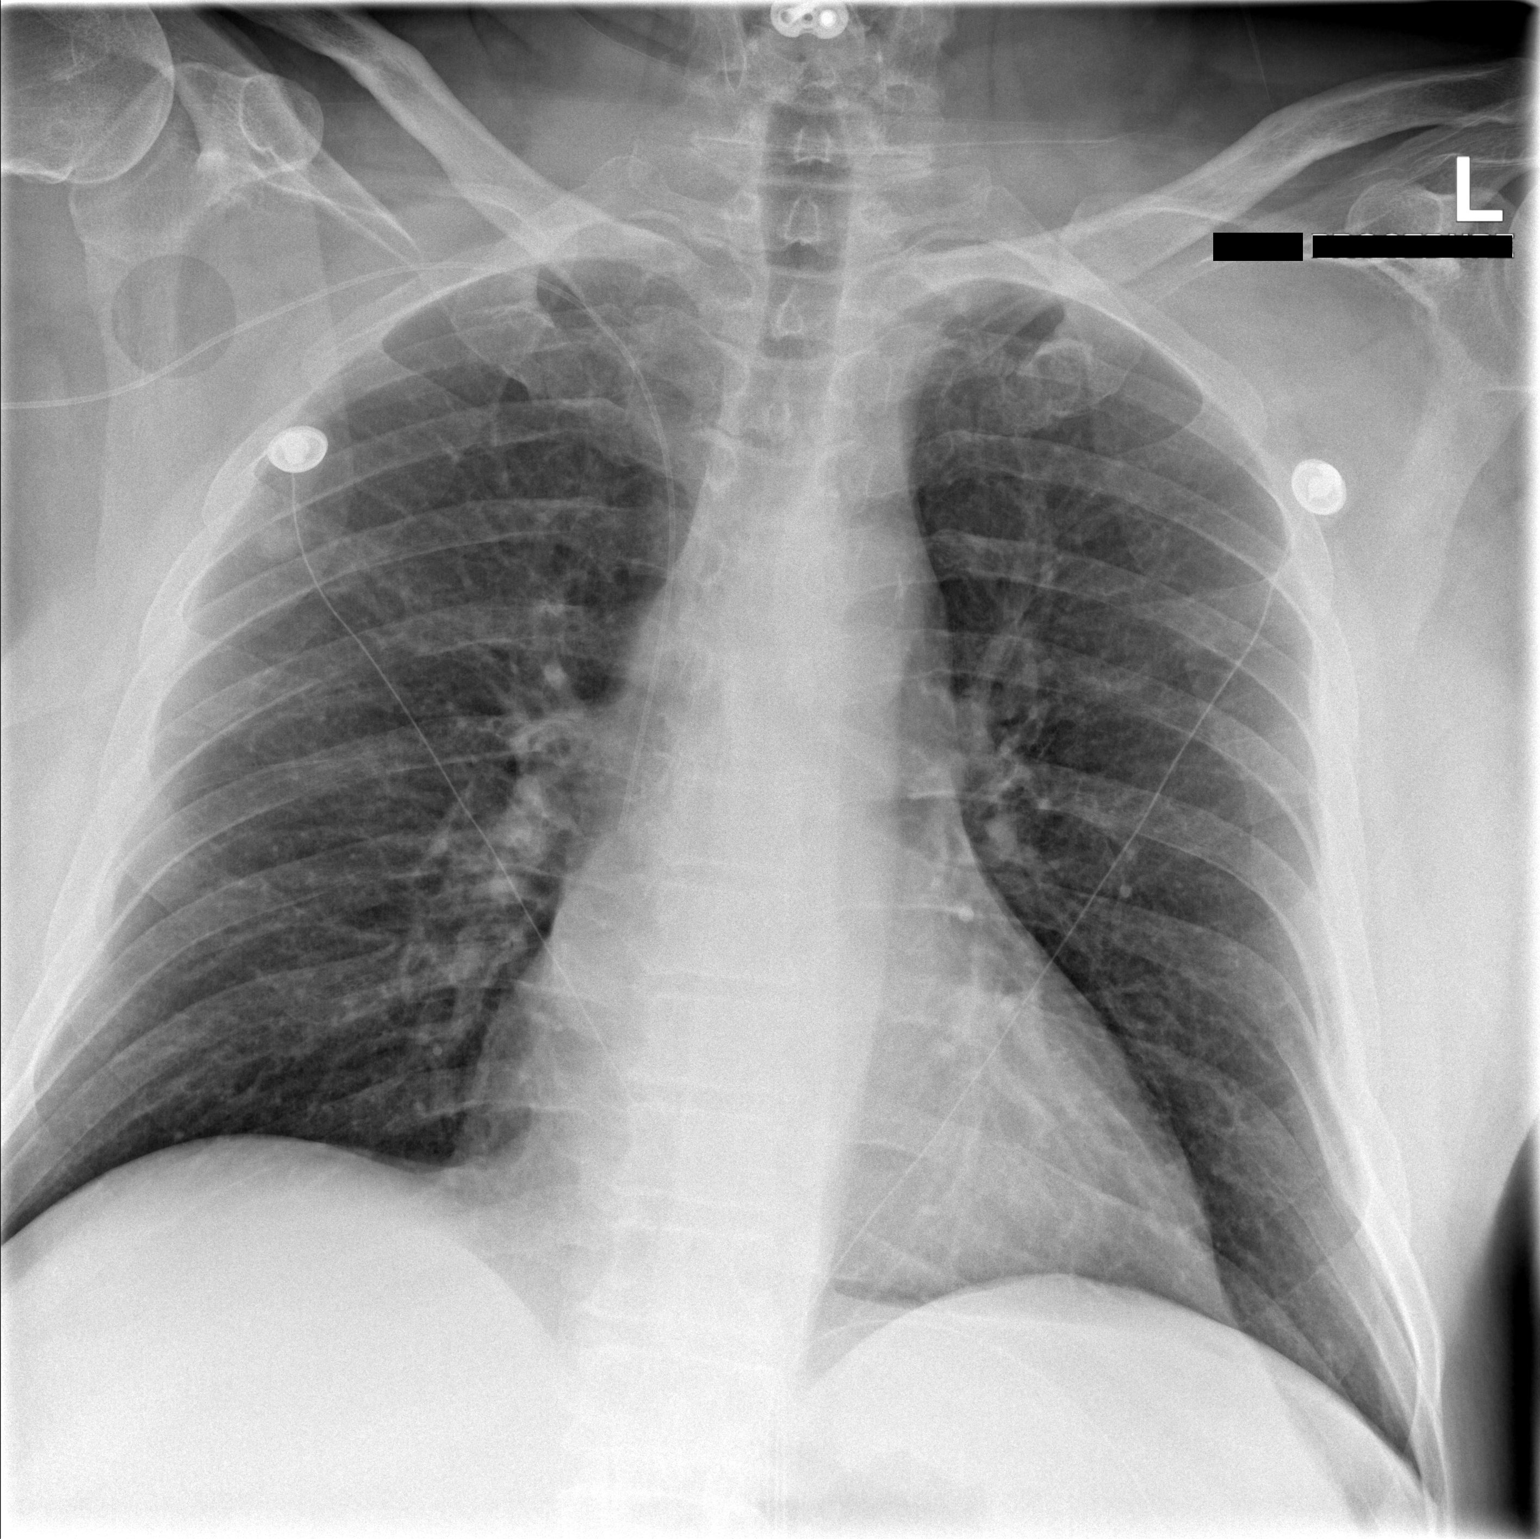

[1 of 1 positions shown; findings below may reference images not displayed]

XR chest 1v post procedure Sex:

DIAGNOSTIC STUDIES

EXAM

XR chest 1v post procedure

INDICATION

acute osteomyelitis of right femur
Post procedure picc line. JT/TJ/AC

TECHNIQUE

AP chest

COMPARISONS

None available

FINDINGS

Cardiac silhouette is within normal limits. Right-sided PICC line is noted distal tip projecting in
the mid SVC. No acute infiltrates are seen. Right upper lobe granuloma is evident. Old left rib
fractures are seen.

IMPRESSION

PICC line placement as described.

Tech Notes:

Post procedure picc line. JT/TJ/AC

## 2024-01-05 IMAGING — RF GDPICCPL
1 series · 2 of 2 positions shown · non-contrast
Comparison: none

[Series 8: fluoro_chest_bw · 2 of 2 frames shown]
[frame 1/2]
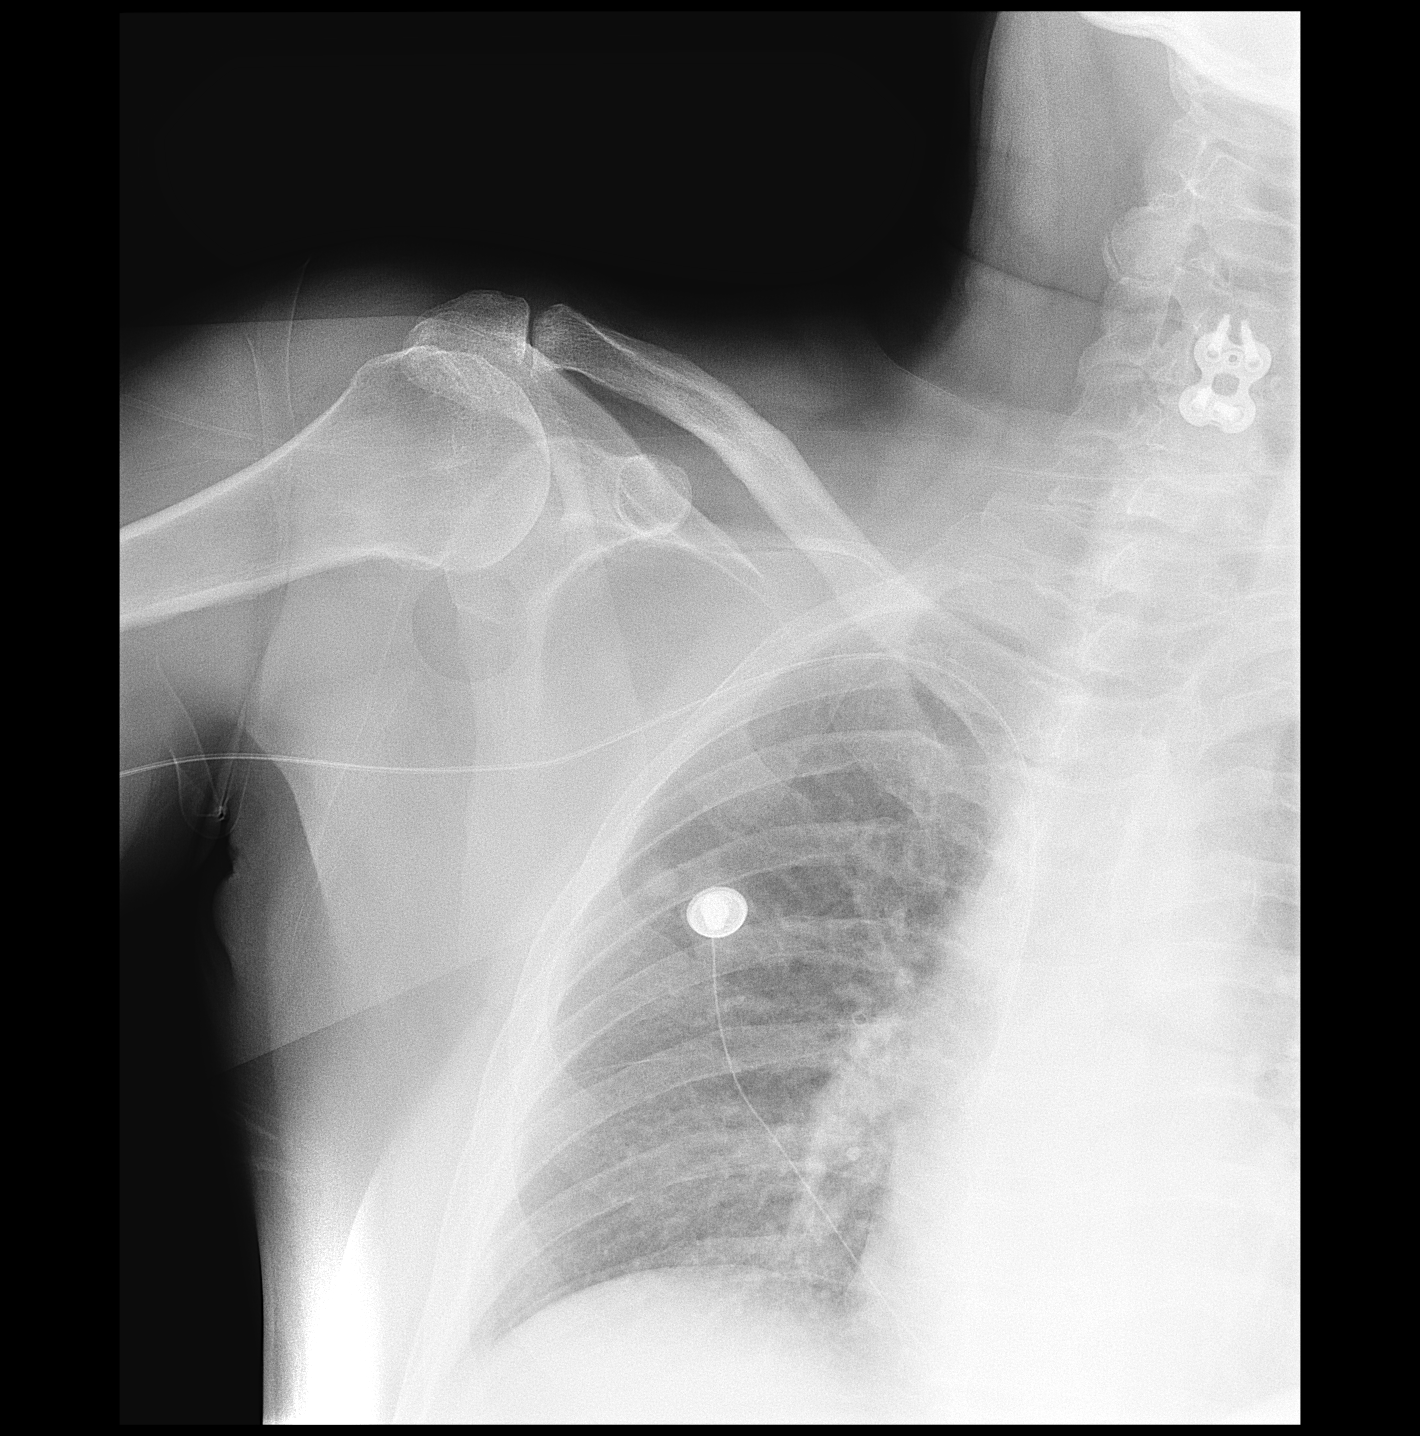
[frame 2/2]
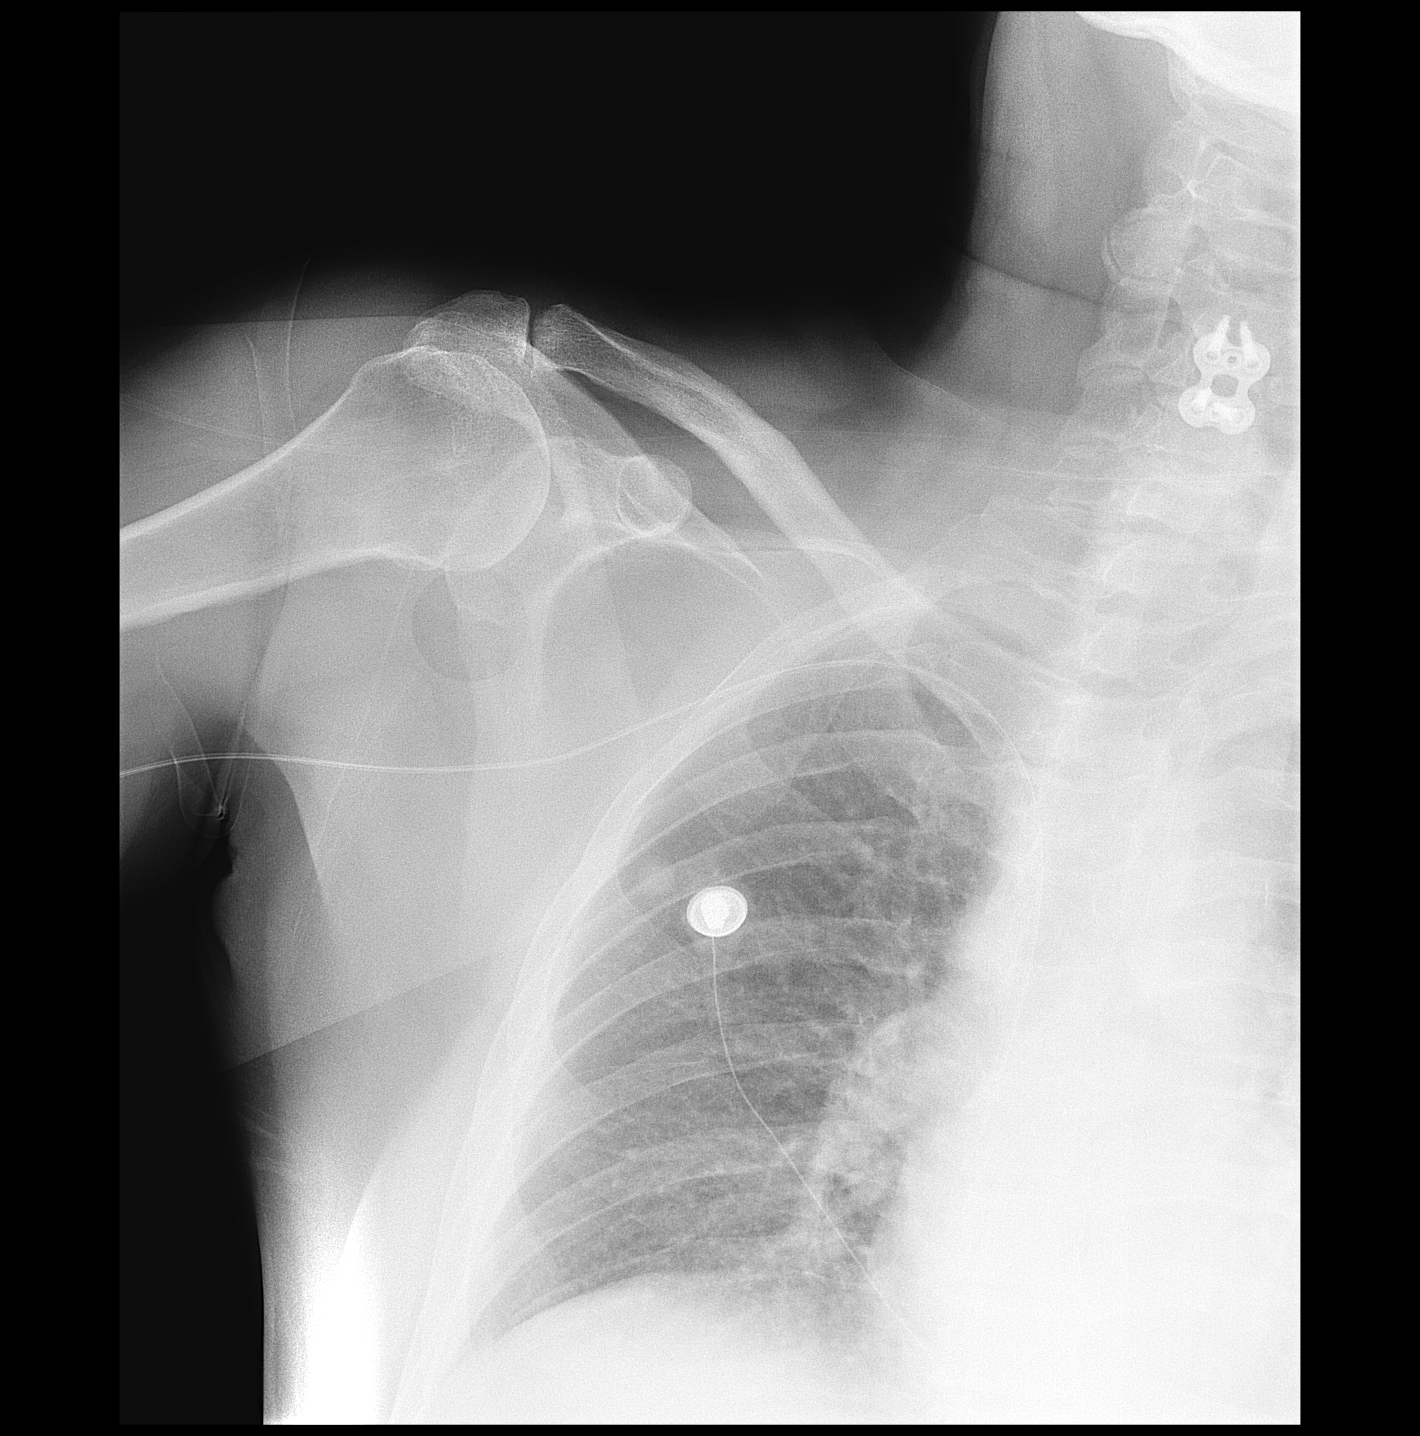

[2 of 2 positions shown; findings below may reference images not displayed]

FL guided PICC placement Sex:

DIAGNOSTIC STUDIES

EXAM

FL guided PICC placement

INDICATION

acute osteomyelitis of right femur
PICC line procedure. Total images: 1  Total time: 0.1 sec  Total dose : 2mGy  JT/TJ/AC

TECHNIQUE

Fluoro time is 0.1 seconds. Single spot film was obtained.

COMPARISONS

None

FINDINGS

Please see procedure note for full details. Fluoroscopic guidance was PICC line placement.

IMPRESSION

Fluoroscopic guidance for PICC line placement.

Tech Notes:

PICC line procedure. Total images: 1  Total time: 0.1 sec  Total dose : 2mGy
JT/TJ/AC

## 2024-01-17 ENCOUNTER — Encounter: Admit: 2024-01-17 | Discharge: 2024-01-17 | Payer: MEDICARE

## 2024-01-18 ENCOUNTER — Encounter: Admit: 2024-01-18 | Discharge: 2024-01-18 | Payer: MEDICARE

## 2024-01-18 NOTE — Telephone Encounter
-----   Message from [place] sent at 01/17/2024  8:42 AM CDT -----  Regarding: Lab  Pt has upcoming OV with [facility], pt needs FLP before this visit. Can you please call this patient to see if he can complete this before 7/8? Thanks!

## 2024-01-18 NOTE — Telephone Encounter
 Called pt requested draw lmom faxed order to [facility]

## 2024-01-22 ENCOUNTER — Encounter: Admit: 2024-01-22 | Discharge: 2024-01-23 | Payer: MEDICARE

## 2024-01-23 ENCOUNTER — Encounter: Admit: 2024-01-23 | Discharge: 2024-01-23 | Payer: MEDICARE

## 2024-01-23 DIAGNOSIS — E785 Hyperlipidemia, unspecified: Secondary | ICD-10-CM

## 2024-01-23 DIAGNOSIS — I10 Essential (primary) hypertension: Secondary | ICD-10-CM

## 2024-01-23 DIAGNOSIS — I739 Peripheral vascular disease, unspecified: Secondary | ICD-10-CM

## 2024-01-23 DIAGNOSIS — Z136 Encounter for screening for cardiovascular disorders: Principal | ICD-10-CM

## 2024-01-23 LAB — LIPID PROFILE
CHOLESTEROL/HDL %: 5
CHOLESTEROL: 146
HDL: 30 — ABNORMAL LOW (ref >=40)
LDL: 89
TRIGLYCERIDES: 139
VLDL: 28

## 2024-01-24 ENCOUNTER — Encounter: Admit: 2024-01-24 | Discharge: 2024-01-24 | Payer: MEDICARE

## 2024-01-24 ENCOUNTER — Ambulatory Visit: Admit: 2024-01-24 | Discharge: 2024-01-24 | Payer: MEDICARE

## 2024-02-08 ENCOUNTER — Encounter: Admit: 2024-02-08 | Discharge: 2024-02-08 | Payer: MEDICARE

## 2024-02-21 ENCOUNTER — Encounter: Admit: 2024-02-21 | Discharge: 2024-02-21 | Payer: MEDICARE

## 2024-03-23 ENCOUNTER — Encounter: Admit: 2024-03-23 | Discharge: 2024-03-23 | Payer: MEDICARE

## 2024-04-10 LAB — LIPID PROFILE
CHOLESTEROL/HDL %: 4
CHOLESTEROL: 146
HDL: 34 — ABNORMAL LOW (ref >=40)
LDL: 96
TRIGLYCERIDES: 83
VLDL: 17

## 2024-04-18 ENCOUNTER — Encounter: Admit: 2024-04-18 | Discharge: 2024-04-18 | Payer: MEDICARE

## 2024-04-23 ENCOUNTER — Encounter: Admit: 2024-04-23 | Discharge: 2024-04-23 | Payer: MEDICARE

## 2024-04-25 ENCOUNTER — Encounter: Admit: 2024-04-25 | Discharge: 2024-04-25 | Payer: MEDICARE

## 2024-04-25 DIAGNOSIS — I10 Essential (primary) hypertension: Principal | ICD-10-CM

## 2024-04-25 DIAGNOSIS — I739 Peripheral vascular disease, unspecified: Secondary | ICD-10-CM

## 2024-04-25 DIAGNOSIS — I447 Left bundle-branch block, unspecified: Secondary | ICD-10-CM

## 2024-04-25 DIAGNOSIS — E785 Hyperlipidemia, unspecified: Secondary | ICD-10-CM

## 2024-05-24 ENCOUNTER — Encounter: Admit: 2024-05-24 | Discharge: 2024-05-24 | Payer: MEDICARE

## 2024-06-25 ENCOUNTER — Encounter: Admit: 2024-06-25 | Discharge: 2024-06-25 | Payer: MEDICARE

## 2024-07-05 ENCOUNTER — Encounter: Admit: 2024-07-05 | Discharge: 2024-07-05 | Payer: MEDICARE

## 2024-07-23 ENCOUNTER — Encounter: Admit: 2024-07-23 | Discharge: 2024-07-23 | Payer: MEDICARE

## 2024-07-24 ENCOUNTER — Encounter: Admit: 2024-07-24 | Discharge: 2024-07-24 | Payer: MEDICARE

## 2024-07-25 ENCOUNTER — Encounter: Admit: 2024-07-25 | Discharge: 2024-07-25 | Payer: MEDICARE

## 2024-08-11 ENCOUNTER — Encounter: Admit: 2024-08-11 | Discharge: 2024-08-11 | Payer: MEDICARE

## 2024-08-13 ENCOUNTER — Encounter: Admit: 2024-08-13 | Discharge: 2024-08-13 | Payer: MEDICARE

## 2024-08-13 DIAGNOSIS — I10 Essential (primary) hypertension: Secondary | ICD-10-CM

## 2024-08-13 DIAGNOSIS — I447 Left bundle-branch block, unspecified: Secondary | ICD-10-CM

## 2024-08-13 DIAGNOSIS — E785 Hyperlipidemia, unspecified: Secondary | ICD-10-CM

## 2024-08-13 DIAGNOSIS — I739 Peripheral vascular disease, unspecified: Secondary | ICD-10-CM

## 2024-08-13 DIAGNOSIS — Z136 Encounter for screening for cardiovascular disorders: Principal | ICD-10-CM

## 2024-08-13 LAB — BASIC METABOLIC PANEL
BLD UREA NITROGEN: 11
CALCIUM: 10
CHLORIDE: 102
CO2: 26
CREATININE: 0.9
GFR ESTIMATED: 91
GLUCOSE,PANEL: 114 — ABNORMAL HIGH (ref 70–105)
POTASSIUM: 3.8
SODIUM: 143

## 2024-08-13 LAB — LIPID PROFILE
CHOLESTEROL/HDL %: 3
CHOLESTEROL: 115
HDL: 35 — ABNORMAL LOW (ref >=40)
LDL: 59
TRIGLYCERIDES: 109
VLDL: 22

## 2024-08-14 ENCOUNTER — Encounter: Admit: 2024-08-14 | Discharge: 2024-08-14 | Payer: MEDICARE
